# Patient Record
Sex: Female | Born: 1950 | Race: Black or African American | Hispanic: No | Smoking: Never smoker
Health system: Southern US, Community
[De-identification: ages and names within clinical notes are randomized; demographics above are authoritative.]

## PROBLEM LIST (undated history)

## (undated) DIAGNOSIS — D0512 Intraductal carcinoma in situ of left breast: Secondary | ICD-10-CM

## (undated) DIAGNOSIS — Z923 Personal history of irradiation: Secondary | ICD-10-CM

## (undated) DIAGNOSIS — C50919 Malignant neoplasm of unspecified site of unspecified female breast: Secondary | ICD-10-CM

## (undated) DIAGNOSIS — R112 Nausea with vomiting, unspecified: Secondary | ICD-10-CM

## (undated) DIAGNOSIS — E7800 Pure hypercholesterolemia, unspecified: Secondary | ICD-10-CM

## (undated) DIAGNOSIS — I10 Essential (primary) hypertension: Secondary | ICD-10-CM

## (undated) DIAGNOSIS — D509 Iron deficiency anemia, unspecified: Secondary | ICD-10-CM

## (undated) DIAGNOSIS — Z9889 Other specified postprocedural states: Secondary | ICD-10-CM

## (undated) HISTORY — PX: TUBAL LIGATION: SHX77

## (undated) HISTORY — PX: ABDOMINAL HYSTERECTOMY: SHX81

## (undated) HISTORY — DX: Intraductal carcinoma in situ of left breast: D05.12

## (undated) HISTORY — DX: Iron deficiency anemia, unspecified: D50.9

---

## 2000-06-29 ENCOUNTER — Encounter: Payer: Self-pay | Admitting: Obstetrics and Gynecology

## 2000-06-29 ENCOUNTER — Ambulatory Visit (HOSPITAL_COMMUNITY): Admission: RE | Admit: 2000-06-29 | Discharge: 2000-06-29 | Payer: Self-pay | Admitting: Obstetrics and Gynecology

## 2001-07-01 ENCOUNTER — Ambulatory Visit (HOSPITAL_COMMUNITY): Admission: RE | Admit: 2001-07-01 | Discharge: 2001-07-01 | Payer: Self-pay | Admitting: Obstetrics and Gynecology

## 2001-07-01 ENCOUNTER — Encounter: Payer: Self-pay | Admitting: Obstetrics and Gynecology

## 2002-12-18 ENCOUNTER — Ambulatory Visit (HOSPITAL_COMMUNITY): Admission: RE | Admit: 2002-12-18 | Discharge: 2002-12-18 | Payer: Self-pay | Admitting: Family Medicine

## 2004-04-26 ENCOUNTER — Ambulatory Visit (HOSPITAL_COMMUNITY): Admission: RE | Admit: 2004-04-26 | Discharge: 2004-04-26 | Payer: Self-pay | Admitting: Family Medicine

## 2005-06-06 ENCOUNTER — Ambulatory Visit (HOSPITAL_COMMUNITY): Admission: RE | Admit: 2005-06-06 | Discharge: 2005-06-06 | Payer: Self-pay | Admitting: Obstetrics and Gynecology

## 2013-12-15 ENCOUNTER — Other Ambulatory Visit (HOSPITAL_COMMUNITY): Payer: Self-pay | Admitting: Family Medicine

## 2013-12-15 DIAGNOSIS — Z1231 Encounter for screening mammogram for malignant neoplasm of breast: Secondary | ICD-10-CM

## 2013-12-17 ENCOUNTER — Ambulatory Visit (HOSPITAL_COMMUNITY)
Admission: RE | Admit: 2013-12-17 | Discharge: 2013-12-17 | Disposition: A | Discharge status: Home / Self Care | Payer: BC Managed Care – PPO | Source: Ambulatory Visit | Attending: Family Medicine | Admitting: Family Medicine

## 2013-12-17 DIAGNOSIS — Z1231 Encounter for screening mammogram for malignant neoplasm of breast: Secondary | ICD-10-CM | POA: Diagnosis present

## 2013-12-22 ENCOUNTER — Other Ambulatory Visit: Payer: Self-pay | Admitting: Family Medicine

## 2013-12-22 DIAGNOSIS — R928 Other abnormal and inconclusive findings on diagnostic imaging of breast: Secondary | ICD-10-CM

## 2014-01-06 ENCOUNTER — Other Ambulatory Visit: Payer: Self-pay | Admitting: Family Medicine

## 2014-01-06 ENCOUNTER — Other Ambulatory Visit (HOSPITAL_COMMUNITY): Payer: Self-pay | Admitting: Family Medicine

## 2014-01-06 ENCOUNTER — Ambulatory Visit (HOSPITAL_COMMUNITY)
Admission: RE | Admit: 2014-01-06 | Discharge: 2014-01-06 | Disposition: A | Discharge status: Home / Self Care | Payer: BC Managed Care – PPO | Source: Ambulatory Visit | Attending: Family Medicine | Admitting: Family Medicine

## 2014-01-06 DIAGNOSIS — R928 Other abnormal and inconclusive findings on diagnostic imaging of breast: Secondary | ICD-10-CM

## 2014-01-06 DIAGNOSIS — N6320 Unspecified lump in the left breast, unspecified quadrant: Secondary | ICD-10-CM

## 2014-01-06 DIAGNOSIS — R921 Mammographic calcification found on diagnostic imaging of breast: Secondary | ICD-10-CM | POA: Insufficient documentation

## 2014-01-27 ENCOUNTER — Ambulatory Visit (HOSPITAL_COMMUNITY)
Admission: RE | Admit: 2014-01-27 | Discharge: 2014-01-27 | Disposition: A | Discharge status: Home / Self Care | Payer: BC Managed Care – PPO | Source: Ambulatory Visit | Attending: Family Medicine | Admitting: Family Medicine

## 2014-01-27 ENCOUNTER — Other Ambulatory Visit (HOSPITAL_COMMUNITY): Payer: Self-pay | Admitting: Family Medicine

## 2014-01-27 ENCOUNTER — Encounter (HOSPITAL_COMMUNITY): Payer: Self-pay

## 2014-01-27 DIAGNOSIS — N63 Unspecified lump in breast: Secondary | ICD-10-CM | POA: Diagnosis present

## 2014-01-27 DIAGNOSIS — N6320 Unspecified lump in the left breast, unspecified quadrant: Secondary | ICD-10-CM

## 2014-01-27 DIAGNOSIS — R928 Other abnormal and inconclusive findings on diagnostic imaging of breast: Secondary | ICD-10-CM

## 2014-01-27 DIAGNOSIS — R921 Mammographic calcification found on diagnostic imaging of breast: Secondary | ICD-10-CM | POA: Insufficient documentation

## 2014-01-27 HISTORY — DX: Essential (primary) hypertension: I10

## 2014-01-27 MED ORDER — LIDOCAINE HCL (PF) 2 % IJ SOLN
INTRAMUSCULAR | Status: AC
  Administered 2014-01-27: 200 mg
  Filled 2014-01-27: qty 10

## 2014-01-27 NOTE — Discharge Instructions (Signed)
Breast Biopsy °A breast biopsy is a test during which a sample of tissue is taken from your breast. The breast tissue is looked at under a microscope for cancer cells.  °BEFORE THE PROCEDURE °· Make plans to have someone drive you home after the test. °· Do not smoke for 2 weeks before the test. Stop smoking, if you smoke. °· Do not drink alcohol for 24 hours before the test. °· Wear a good support bra to the test. °PROCEDURE  °You may be given one of the following: °· A medicine to numb the breast area (local anesthetic). °· A medicine to make you fall asleep (general anesthetic). °There are different types of breast biopsies. They include: °· Fine-needle aspiration. °¨ A needle is put into the breast lump. °¨ The needle takes out fluid and cells from the lump. °¨ Ultrasound imaging may be used to help find the lump and to put the needle in the right spot. °· Core-needle biopsy. °¨ A needle is put into the breast lump. °¨ The needle is put in your breast 3-6 times. °¨ The needle removes breast tissue. °¨ An ultrasound image or X-ray is often used to find the right spot to put in the needle. °· Stereotactic biopsy. °¨ X-rays and a computer are used to study X-ray pictures of the breast lump. °¨ The computer finds where the needle needs to be put into the breast. °¨ Tissue samples are taken out. °· Vacuum-assisted biopsy. °¨ A small cut (incision) is made in your breast. °¨ A biopsy device is put through the cut and into the breast tissue. °¨ The biopsy device draws abnormal breast tissue into the biopsy device. °¨ A large tissue sample is often removed. °¨ No stitches are needed. °· Ultrasound-guided core-needle biopsy. °¨ Ultrasound imaging helps guide the needle into the area of the breast that is not normal. °¨ A cut is made in the breast. The needle is put into the breast lump. °¨ Tissue samples are taken out. °· Open biopsy. °¨ A large cut is made in the breast. °¨ Your doctor will try to remove the whole  breast lump or as much as possible. °All tissue, fluid, or cell samples are looked at under a microscope.  °AFTER THE PROCEDURE °· You will be taken to an area to recover. You will be able to go home once you are doing well and are without problems. °· You may have bruising on your breast. This is normal. °· A pressure bandage (dressing) may be put on your breast for 24-48 hours. This type of bandage is wrapped tightly around your chest. It helps stop fluid from building up underneath tissues. °Document Released: 04/24/2011 Document Revised: 06/16/2013 Document Reviewed: 04/24/2011 °ExitCare® Patient Information ©2015 [employer]. This information is not intended to replace advice given to you by your health care provider. Make sure you discuss any questions you have with your health care provider. °Breast Biopsy °Care After °These instructions give you information on caring for yourself after your procedure. Your doctor may also give you more specific instructions. Call your doctor if you have any problems or questions after your procedure. °HOME CARE °· Only take medicine as told by your doctor. °· Do not take aspirin. °· Keep your sutures (stitches) dry when bathing. °· Protect the biopsy area. Do not let the area get bumped. °· Avoid activities that could pull the biopsy site open until your doctor approves. This includes: °· Stretching. °· Reaching. °· Exercise. °·   Sports. °· Lifting more than 3lb. °· Continue your normal diet. °· Wear a good support bra for as long as told by your doctor. °· Change any bandages (dressings) as told by your doctor. °· Do not drink alcohol while taking pain medicine. °· Keep all doctor visits as told. Ask when your test results will be ready. Make sure you get your test results. °GET HELP RIGHT AWAY IF:  °· You have a fever. °· You have more bleeding (more than a small spot) from the biopsy site. °· You have trouble breathing. °· You have yellowish-white fluid (pus) coming from  the biopsy site. °· You have redness, puffiness (swelling), or more pain in the biopsy site. °· You have a bad smell coming from the biopsy site. °· Your biopsy site opens after sutures, staples, or sticky strips have been removed. °· You have a rash. °· You need stronger medicine. °MAKE SURE YOU: °· Understand these instructions. °· Will watch your condition. °· Will get help right away if you are not doing well or get worse. °Document Released: 11/26/2008 Document Revised: 04/24/2011 Document Reviewed: 03/12/2011 °ExitCare® Patient Information ©2015 [employer]. This information is not intended to replace advice given to you by your health care provider. Make sure you discuss any questions you have with your health care provider. ° °

## 2014-01-29 ENCOUNTER — Other Ambulatory Visit: Payer: Self-pay | Admitting: Family Medicine

## 2014-01-29 DIAGNOSIS — D0512 Intraductal carcinoma in situ of left breast: Secondary | ICD-10-CM

## 2014-02-04 ENCOUNTER — Ambulatory Visit (HOSPITAL_COMMUNITY)
Admission: RE | Admit: 2014-02-04 | Discharge: 2014-02-04 | Disposition: A | Discharge status: Home / Self Care | Payer: BC Managed Care – PPO | Source: Ambulatory Visit | Attending: Family Medicine | Admitting: Family Medicine

## 2014-02-04 DIAGNOSIS — D0512 Intraductal carcinoma in situ of left breast: Secondary | ICD-10-CM

## 2014-02-04 DIAGNOSIS — C50912 Malignant neoplasm of unspecified site of left female breast: Secondary | ICD-10-CM | POA: Diagnosis present

## 2014-02-04 LAB — POCT I-STAT CREATININE: Creatinine, Ser: 1.2 mg/dL — ABNORMAL HIGH (ref 0.50–1.10)

## 2014-02-04 MED ORDER — GADOBENATE DIMEGLUMINE 529 MG/ML IV SOLN
15.0000 mL | Freq: Once | INTRAVENOUS | Status: AC | PRN
  Administered 2014-02-04: 15 mL via INTRAVENOUS

## 2014-02-19 ENCOUNTER — Other Ambulatory Visit (HOSPITAL_COMMUNITY): Payer: Self-pay | Admitting: General Surgery

## 2014-02-19 DIAGNOSIS — C50912 Malignant neoplasm of unspecified site of left female breast: Secondary | ICD-10-CM

## 2014-02-20 NOTE — H&P (Signed)
  NTS SOAP Note  Vital Signs:  Vitals as of: 32/01/2481: Systolic 500: Diastolic 80: Heart Rate 63: Temp 97.82F: Height 5ft 7in: Weight 164Lbs 0 Ounces: BMI 25.69  BMI : 25.69 kg/m2  Subjective: This 64 year old female presents for of a left breast cancer.  Found on mammography.  Biopsy positive for DCIS,  high grade.  MRI reviewed.  No family h/o breast cancer,  nipple discharge.  Patient thinks she feels lump.  Review of Symptoms:  Constitutional:unremarkable   dizzy spells Eyes:unremarkable   Nose/Mouth/Throat:unremarkable Cardiovascular:  unremarkable Respiratory:unremarkable Gastrointestinal:  unremarkable   Genitourinary:unremarkable   Musculoskeletal:unremarkable Skin:unremarkable as above Hematolgic/Lymphatic:unremarkable   Allergic/Immunologic:unremarkable   Past Medical History:  Reviewed  Past Medical History  Surgical History: none Medical Problems: HTN,  high cholesterol Allergies: nkda Medications: amlodipine,  triamterence,  atorvastatin   Social History:Reviewed  Social History  Preferred Language: English Race:  Black or African American Ethnicity: Not Hispanic / Latino Age: 47 year Marital Status:  M   Smoking Status: Never smoker reviewed on 02/12/2014 Functional Status reviewed on 02/12/2014 ------------------------------------------------ Bathing: Normal Cooking: Normal Dressing: Normal Driving: Normal Eating: Normal Managing Meds: Normal Oral Care: Normal Shopping: Normal Toileting: Normal Transferring: Normal Walking: Normal Cognitive Status reviewed on 02/12/2014 ------------------------------------------------ Attention: Normal Decision Making: Normal Language: Normal Memory: Normal Motor: Normal Perception: Normal Problem Solving: Normal Visual and Spatial: Normal   Family History:Reviewed  Family Health History Mother, Deceased; Hypertension (high blood pressure);  Father, Deceased; Hypertension  (high blood pressure);     Objective Information: General:Well appearing, well nourished in no distress. Neck:Supple without lymphadenopathy.  Heart:RRR, no murmur or gallop.  Normal S1, S2.  No S3, S4.  Lungs:  CTA bilaterally, no wheezes, rhonchi, rales.  Breathing unlabored. Left breast with 2.5cm dominant mass at the 2 o'clock position.  No nipple discharge,  diimpling.  Axilla negative for palpable nodes.  Right breast unremarkable. MRI and path reports reviewed. Assessment:Left breast carcinoma  Diagnoses: 174.9  C50.912 Primary malignant neoplasm of female breast (Malignant neoplasm of unspecified site of left female breast)  Procedures: 37048 - OFFICE OUTPATIENT NEW 30 MINUTES    Plan:Discussed surgical options including MRM vs partial mastectomy/sentinel lymph node biopsy/XRT.  Literature given.  All questions answered.  Patient is scheduled for a left partial mastectomy, sentinel lymph node biopsy, possible axillary dissection on 02/25/2014.  Patient Education:Alternative treatments to surgery were discussed with patient (and family).  Risks and benefits  of procedure were fully explained to the patient (and family) who gave informed consent. Patient/family questions were addressed.

## 2014-02-20 NOTE — Patient Instructions (Signed)
Stacy [name]  02/20/2014   Your procedure is scheduled on:  02/25/2014  Report to [facility] at  0800  AM.  Call this number if you have problems the morning of surgery: [phone]   Remember:   Do not eat food or drink liquids after midnight.   Take these medicines the morning of surgery with A SIP OF WATER:  Amlodipine, lisinopril    Do not wear jewelry, make-up or nail polish.  Do not wear lotions, powders, or perfumes.   Do not shave 48 hours prior to surgery. Men may shave face and neck.  Do not bring valuables to the hospital.  [facility] is not responsible for any belongings or valuables.               Contacts, dentures or bridgework may not be worn into surgery.  Leave suitcase in the car. After surgery it may be brought to your room.  For patients admitted to the hospital, discharge time is determined by your treatment team.               Patients discharged the day of surgery will not be allowed to drive home.  Name and phone number of your driver: family  Special Instructions: Shower using CHG 2 nights before surgery and the night before surgery.  If you shower the day of surgery use CHG.  Use special wash - you have one bottle of CHG for all showers.  You should use approximately 1/3 of the bottle for each shower.   Please read over the following fact sheets that you were given: Pain Booklet, Coughing and Deep Breathing, Surgical Site Infection Prevention, Anesthesia Post-op Instructions and Care and Recovery After Surgery Breast Biopsy A breast biopsy is a procedure where a sample of breast tissue is removed from your breast. The tissue is examined under a microscope to see if cancerous cells are present. A breast biopsy is done when there is:  Any undiagnosed breast mass (tumor).  Nipple abnormalities, dimpling, crusting, or ulcerations.  Abnormal discharge from the nipple, especially blood.  Redness, swelling, and pain of the breast.  Calcium  deposits (calcifications) or abnormalities seen on a mammogram, ultrasound result, or results of magnetic resonance imaging (MRI).  Suspicious changes in the breast seen on your mammogram. If the tumor is found to be cancerous (malignant), a breast biopsy can help to determine what the best treatment is for you. There are many different types of breast biopsies. Talk to your caregiver about your options and which type is best for you. LET YOUR CAREGIVER KNOW ABOUT:  Allergies to food or medicine.  Medicines taken, including vitamins, herbs, eyedrops, over-the-counter medicines, and creams.  Use of steroids (by mouth or creams).  Previous problems with anesthetics or numbing medicines.  History of bleeding problems or blood clots.  Previous surgery.  Other health problems, including diabetes and kidney problems.  Any recent colds or infections.  Possibility of pregnancy, if this applies. RISKS AND COMPLICATIONS   Bleeding.  Infection.  Allergy to medicines.  Bruising and swelling of the breast.  Alteration in the shape of the breast.  Not finding the lump or abnormality.  Needing more surgery. BEFORE THE PROCEDURE  Arrange for someone to drive you home after the procedure.  Do not smoke for 2 weeks before the procedure. Stop smoking, if you smoke.  Do not drink alcohol for 24 hours before procedure.  Wear a good support bra  to the procedure. PROCEDURE  You may be given a medicine to numb the breast area (local anesthesia) or a medicine to make you sleep (general anesthesia) during the procedure. The following are the different types of biopsies that can be performed.   Fine-needle aspiration--A thin needle is attached to a syringe and inserted into the breast lump. Fluid and cells are removed and then looked at under a microscope. If the breast lump cannot be felt, an ultrasound may be used to help locate the lump and place the needle in the correct area.   Core  needle biopsy--A wide, hollow needle (core needle) is inserted into the breast lump 3-6 times to get tissue samples or cores. The samples are removed. The needle is usually placed in the correct area by using an ultrasound or X-ray.   Stereotactic biopsy--X-ray equipment and a computer are used to analyze X-ray pictures of the breast lump. The computer then finds exactly where the core needle needs to be inserted. Tissue samples are removed.   Vacuum-assisted biopsy--A small incision (less than  inch) is made in your breast. A biopsy device that includes a hollow needle and vacuum is passed through the incision and into the breast tissue. The vacuum gently draws abnormal breast tissue into the needle to remove it. This type of biopsy removes a larger tissue sample than a regular core needle biopsy. No stitches are needed, and there is usually little scarring.  Ultrasound-guided core needle biopsy--A high frequency ultrasound helps guide the core needle to the area of the mass or abnormality. An incision is made to insert the needle. Tissue samples are removed.  Open biopsy--A larger incision is made in the breast. Your caregiver will attempt to remove the whole breast lump or as much as possible. AFTER THE PROCEDURE  You will be taken to the recovery area. If you are doing well and have no problems, you will be allowed to go home.  You may notice bruising on your breast. This is normal.  Your caregiver may apply a pressure dressing on your breast for 24-48 hours. A pressure dressing is a bandage that is wrapped tightly around the chest to stop fluid from collecting underneath tissues. Document Released: 01/30/2005 Document Revised: 05/27/2012 Document Reviewed: 03/02/2011 [employer] Patient Information 2015 [place], [place]. This information is not intended to replace advice given to you by your health care provider. Make sure you discuss any questions you have with your health care  provider. Sentinel Lymph Node Biopsy Sentinel lymph node biopsy is a procedure in which a single lymph node is identified, removed, and examined for cancer. Lymph nodes are collections of tissue that help filter infections, cancer cells, and other waste substances from the bloodstream. Certain types of cancer can spread to nearby lymph nodes. The cancer spreads to one lymph node first, and then to others. The first lymph node that your cancer could spread to is called the sentinel lymph node. Examining the sentinel lymph node for cancer can help your caregiver plan future treatment for you. LET YOUR CAREGIVER KNOW ABOUT:   Allergies to food or medicine.  Medicines taken, including vitamins, herbs, eyedrops, over-the-counter medicines, and creams.  Use of steroids (by mouth or creams).  Previous problems with numbing medicines.  History of bleeding problems or blood clots.  Previous surgery.  Other health problems, including diabetes and kidney problems.  Possibility of pregnancy, if this applies. RISKS AND COMPLICATIONS   Infection.  Bleeding.  Allergic reaction to the dye used  for the procedure.  Blue staining of the skin where the dye is injected.  Damaged lymph vessels, causing a buildup of fluid (lymphedema).  Pain or bruising at the biopsy site. BEFORE THE PROCEDURE   Stop smoking at least 2 weeks before the procedure. Not smoking will improve your health after the procedure and decrease the chance of getting a wound infection.  You may have blood tests to make sure your blood clots normally.  Ask your caregiver about changing or stopping your regular medicines.  Do not eat or drink anything for 8 hours before the procedure. PROCEDURE   You will be given medicine that makes you sleep (general anesthetic).  A blue, radioactive dye will be injected near the tumor. The dye will then spread into the sentinel lymph node.  A scanner will identify the sentinel lymph  node.  A small cut (incision) will be made, and the sentinel lymph node will be removed.  The sentinel lymph node will be examined in a lab. Sometimes, a sentinel lymph node biopsy is performed during another surgery, such as a mastectomy or lumpectomy for breast cancer.  AFTER THE PROCEDURE   You will go to a recovery room.  You will be monitored for several hours.  If complications do not occur, you will be allowed to go home a few hours after the procedure.  Your urine may be blue for the next 24 hours. This is normal. It is caused by the dye used during the procedure.  Your skin where the dye was injected may be blue for up to 8 weeks. Document Released: 04/24/2011 Document Reviewed: 03/21/2013 [facility] Patient Information 2015 [place]. This information is not intended to replace advice given to you by your health care provider. Make sure you discuss any questions you have with your health care provider. PATIENT INSTRUCTIONS POST-ANESTHESIA  IMMEDIATELY FOLLOWING SURGERY:  Do not drive or operate machinery for the first twenty four hours after surgery.  Do not make any important decisions for twenty four hours after surgery or while taking narcotic pain medications or sedatives.  If you develop intractable nausea and vomiting or a severe headache please notify your doctor immediately.  FOLLOW-UP:  Please make an appointment with your surgeon as instructed. You do not need to follow up with anesthesia unless specifically instructed to do so.  WOUND CARE INSTRUCTIONS (if applicable):  Keep a dry clean dressing on the anesthesia/puncture wound site if there is drainage.  Once the wound has quit draining you may leave it open to air.  Generally you should leave the bandage intact for twenty four hours unless there is drainage.  If the epidural site drains for more than 36-48 hours please call the anesthesia department.  QUESTIONS?:  Please feel free to call your physician or the  hospital operator if you have any questions, and they will be happy to assist you.

## 2014-02-23 ENCOUNTER — Ambulatory Visit (HOSPITAL_COMMUNITY)
Admission: RE | Admit: 2014-02-23 | Discharge: 2014-02-23 | Disposition: A | Discharge status: Home / Self Care | Payer: BLUE CROSS/BLUE SHIELD | Source: Ambulatory Visit | Attending: General Surgery | Admitting: General Surgery

## 2014-02-23 ENCOUNTER — Ambulatory Visit (HOSPITAL_COMMUNITY): Payer: BLUE CROSS/BLUE SHIELD

## 2014-02-23 ENCOUNTER — Other Ambulatory Visit: Payer: Self-pay

## 2014-02-23 ENCOUNTER — Encounter (HOSPITAL_COMMUNITY): Payer: Self-pay

## 2014-02-23 ENCOUNTER — Encounter (HOSPITAL_COMMUNITY)
Admission: RE | Admit: 2014-02-23 | Discharge: 2014-02-23 | Disposition: A | Discharge status: Home / Self Care | Payer: BLUE CROSS/BLUE SHIELD | Source: Ambulatory Visit | Attending: General Surgery | Admitting: General Surgery

## 2014-02-23 VITALS — BP 145/61 | HR 60 | Temp 97.6°F | Resp 18 | Ht 67.0 in | Wt 164.0 lb

## 2014-02-23 DIAGNOSIS — R531 Weakness: Secondary | ICD-10-CM | POA: Insufficient documentation

## 2014-02-23 DIAGNOSIS — C50919 Malignant neoplasm of unspecified site of unspecified female breast: Secondary | ICD-10-CM

## 2014-02-23 DIAGNOSIS — C50912 Malignant neoplasm of unspecified site of left female breast: Secondary | ICD-10-CM

## 2014-02-23 HISTORY — DX: Other specified postprocedural states: Z98.890

## 2014-02-23 HISTORY — DX: Pure hypercholesterolemia, unspecified: E78.00

## 2014-02-23 HISTORY — DX: Other specified postprocedural states: R11.2

## 2014-02-23 LAB — CBC WITH DIFFERENTIAL/PLATELET
Basophils Absolute: 0 10*3/uL (ref 0.0–0.1)
Basophils Relative: 0 % (ref 0–1)
Eosinophils Absolute: 0.1 10*3/uL (ref 0.0–0.7)
Eosinophils Relative: 2 % (ref 0–5)
HCT: 35.9 % — ABNORMAL LOW (ref 36.0–46.0)
HEMOGLOBIN: 11.3 g/dL — AB (ref 12.0–15.0)
Lymphocytes Relative: 33 % (ref 12–46)
Lymphs Abs: 1.6 10*3/uL (ref 0.7–4.0)
MCH: 23 pg — ABNORMAL LOW (ref 26.0–34.0)
MCHC: 31.5 g/dL (ref 30.0–36.0)
MCV: 73 fL — ABNORMAL LOW (ref 78.0–100.0)
MONOS PCT: 7 % (ref 3–12)
Monocytes Absolute: 0.4 10*3/uL (ref 0.1–1.0)
NEUTROS PCT: 58 % (ref 43–77)
Neutro Abs: 2.7 10*3/uL (ref 1.7–7.7)
Platelets: 202 10*3/uL (ref 150–400)
RBC: 4.92 MIL/uL (ref 3.87–5.11)
RDW: 15.2 % (ref 11.5–15.5)
WBC: 4.8 10*3/uL (ref 4.0–10.5)

## 2014-02-23 LAB — COMPREHENSIVE METABOLIC PANEL
ALT: 20 U/L (ref 0–35)
ANION GAP: 8 (ref 5–15)
AST: 24 U/L (ref 0–37)
Albumin: 4.4 g/dL (ref 3.5–5.2)
Alkaline Phosphatase: 71 U/L (ref 39–117)
BUN: 18 mg/dL (ref 6–23)
CHLORIDE: 102 meq/L (ref 96–112)
CO2: 29 mmol/L (ref 19–32)
Calcium: 9.7 mg/dL (ref 8.4–10.5)
Creatinine, Ser: 1.07 mg/dL (ref 0.50–1.10)
GFR calc Af Amer: 64 mL/min — ABNORMAL LOW (ref >90)
GFR calc non Af Amer: 55 mL/min — ABNORMAL LOW (ref >90)
Glucose, Bld: 120 mg/dL — ABNORMAL HIGH (ref 70–99)
Potassium: 3.9 mmol/L (ref 3.5–5.1)
Sodium: 139 mmol/L (ref 135–145)
Total Bilirubin: 0.6 mg/dL (ref 0.3–1.2)
Total Protein: 7.6 g/dL (ref 6.0–8.3)

## 2014-02-23 NOTE — Pre-Procedure Instructions (Signed)
Patient given information to sign up for my chart at home. 

## 2014-02-25 ENCOUNTER — Encounter (HOSPITAL_COMMUNITY): Payer: Self-pay | Admitting: *Deleted

## 2014-02-25 ENCOUNTER — Ambulatory Visit (HOSPITAL_COMMUNITY): Payer: BLUE CROSS/BLUE SHIELD | Admitting: Anesthesiology

## 2014-02-25 ENCOUNTER — Encounter (HOSPITAL_COMMUNITY)
Admission: RE | Disposition: A | Discharge status: Home / Self Care | Payer: Self-pay | Source: Ambulatory Visit | Attending: General Surgery

## 2014-02-25 ENCOUNTER — Ambulatory Visit (HOSPITAL_COMMUNITY): Payer: BLUE CROSS/BLUE SHIELD

## 2014-02-25 ENCOUNTER — Encounter (HOSPITAL_COMMUNITY)
Admission: RE | Admit: 2014-02-25 | Discharge: 2014-02-25 | Disposition: A | Discharge status: Home / Self Care | Payer: BLUE CROSS/BLUE SHIELD | Source: Ambulatory Visit | Attending: General Surgery | Admitting: General Surgery

## 2014-02-25 ENCOUNTER — Ambulatory Visit (HOSPITAL_COMMUNITY)
Admission: RE | Admit: 2014-02-25 | Discharge: 2014-02-25 | Disposition: A | Discharge status: Home / Self Care | Payer: BLUE CROSS/BLUE SHIELD | Source: Ambulatory Visit | Attending: General Surgery | Admitting: General Surgery

## 2014-02-25 DIAGNOSIS — C50912 Malignant neoplasm of unspecified site of left female breast: Secondary | ICD-10-CM

## 2014-02-25 DIAGNOSIS — Z79899 Other long term (current) drug therapy: Secondary | ICD-10-CM | POA: Insufficient documentation

## 2014-02-25 DIAGNOSIS — I10 Essential (primary) hypertension: Secondary | ICD-10-CM | POA: Insufficient documentation

## 2014-02-25 DIAGNOSIS — C50919 Malignant neoplasm of unspecified site of unspecified female breast: Secondary | ICD-10-CM

## 2014-02-25 DIAGNOSIS — N6320 Unspecified lump in the left breast, unspecified quadrant: Secondary | ICD-10-CM

## 2014-02-25 HISTORY — PX: PARTIAL MASTECTOMY WITH AXILLARY SENTINEL LYMPH NODE BIOPSY: SHX6004

## 2014-02-25 HISTORY — DX: Malignant neoplasm of unspecified site of unspecified female breast: C50.919

## 2014-02-25 SURGERY — PARTIAL MASTECTOMY WITH AXILLARY SENTINEL LYMPH NODE BIOPSY
Anesthesia: General | Site: Breast | Laterality: Left

## 2014-02-25 MED ORDER — MIDAZOLAM HCL 2 MG/2ML IJ SOLN
INTRAMUSCULAR | Status: AC
  Filled 2014-02-25: qty 2

## 2014-02-25 MED ORDER — GLYCOPYRROLATE 0.2 MG/ML IJ SOLN
INTRAMUSCULAR | Status: DC | PRN
  Administered 2014-02-25: 0.6 mg via INTRAVENOUS
  Administered 2014-02-25 (×2): 0.2 mg via INTRAVENOUS

## 2014-02-25 MED ORDER — ONDANSETRON HCL 4 MG/2ML IJ SOLN
4.0000 mg | Freq: Once | INTRAMUSCULAR | Status: AC | PRN
  Administered 2014-02-25: 4 mg via INTRAVENOUS
  Filled 2014-02-25: qty 2

## 2014-02-25 MED ORDER — NEOSTIGMINE METHYLSULFATE 10 MG/10ML IV SOLN
INTRAVENOUS | Status: AC
  Filled 2014-02-25: qty 1

## 2014-02-25 MED ORDER — 0.9 % SODIUM CHLORIDE (POUR BTL) OPTIME
TOPICAL | Status: DC | PRN
  Administered 2014-02-25: 1000 mL

## 2014-02-25 MED ORDER — SODIUM CHLORIDE 0.9 % IJ SOLN
INTRAMUSCULAR | Status: DC | PRN
  Administered 2014-02-25: 10:00:00 via INTRAMUSCULAR

## 2014-02-25 MED ORDER — KETOROLAC TROMETHAMINE 30 MG/ML IJ SOLN
30.0000 mg | Freq: Once | INTRAMUSCULAR | Status: AC
  Administered 2014-02-25: 30 mg via INTRAVENOUS

## 2014-02-25 MED ORDER — SCOPOLAMINE 1 MG/3DAYS TD PT72
MEDICATED_PATCH | TRANSDERMAL | Status: AC
  Filled 2014-02-25: qty 1

## 2014-02-25 MED ORDER — SODIUM CHLORIDE 0.9 % IJ SOLN
INTRAMUSCULAR | Status: AC
  Filled 2014-02-25: qty 10

## 2014-02-25 MED ORDER — SODIUM CHLORIDE 0.9 % IJ SOLN
INTRAMUSCULAR | Status: DC | PRN
  Administered 2014-02-25: 3 mL

## 2014-02-25 MED ORDER — PENTAFLUOROPROP-TETRAFLUOROETH EX AERO
INHALATION_SPRAY | CUTANEOUS | Status: AC
  Filled 2014-02-25: qty 103.5

## 2014-02-25 MED ORDER — DEXAMETHASONE SODIUM PHOSPHATE 4 MG/ML IJ SOLN
INTRAMUSCULAR | Status: AC
  Filled 2014-02-25: qty 1

## 2014-02-25 MED ORDER — KETOROLAC TROMETHAMINE 30 MG/ML IJ SOLN
INTRAMUSCULAR | Status: AC
  Filled 2014-02-25: qty 1

## 2014-02-25 MED ORDER — BUPIVACAINE HCL (PF) 0.5 % IJ SOLN
INTRAMUSCULAR | Status: DC | PRN
  Administered 2014-02-25: 10 mL

## 2014-02-25 MED ORDER — GLYCOPYRROLATE 0.2 MG/ML IJ SOLN
INTRAMUSCULAR | Status: AC
  Filled 2014-02-25: qty 4

## 2014-02-25 MED ORDER — PHENYLEPHRINE HCL 10 MG/ML IJ SOLN
INTRAMUSCULAR | Status: AC
  Filled 2014-02-25: qty 1

## 2014-02-25 MED ORDER — FENTANYL CITRATE 0.05 MG/ML IJ SOLN: 25.0000 ug | INTRAMUSCULAR | Status: DC | PRN

## 2014-02-25 MED ORDER — PROPOFOL 10 MG/ML IV BOLUS
INTRAVENOUS | Status: AC
  Filled 2014-02-25: qty 20

## 2014-02-25 MED ORDER — CHLORHEXIDINE GLUCONATE 4 % EX LIQD: 1.0000 "application " | Freq: Once | CUTANEOUS | Status: DC

## 2014-02-25 MED ORDER — LIDOCAINE HCL (CARDIAC) 20 MG/ML IV SOLN
INTRAVENOUS | Status: DC | PRN
  Administered 2014-02-25: 25 mg via INTRAVENOUS

## 2014-02-25 MED ORDER — LACTATED RINGERS IV SOLN
INTRAVENOUS | Status: DC
  Administered 2014-02-25: 09:00:00 via INTRAVENOUS

## 2014-02-25 MED ORDER — DEXAMETHASONE SODIUM PHOSPHATE 4 MG/ML IJ SOLN
4.0000 mg | Freq: Once | INTRAMUSCULAR | Status: AC
  Administered 2014-02-25: 4 mg via INTRAVENOUS

## 2014-02-25 MED ORDER — SCOPOLAMINE 1 MG/3DAYS TD PT72
1.0000 | MEDICATED_PATCH | Freq: Once | TRANSDERMAL | Status: DC
  Administered 2014-02-25: 1.5 mg via TRANSDERMAL

## 2014-02-25 MED ORDER — ROCURONIUM BROMIDE 50 MG/5ML IV SOLN
INTRAVENOUS | Status: AC
  Filled 2014-02-25: qty 1

## 2014-02-25 MED ORDER — METHYLENE BLUE 1 % INJ SOLN
INTRAMUSCULAR | Status: AC
  Filled 2014-02-25: qty 2

## 2014-02-25 MED ORDER — NEOSTIGMINE METHYLSULFATE 10 MG/10ML IV SOLN
INTRAVENOUS | Status: DC | PRN
  Administered 2014-02-25: 4 mg via INTRAVENOUS

## 2014-02-25 MED ORDER — LACTATED RINGERS IV SOLN
INTRAVENOUS | Status: DC | PRN
  Administered 2014-02-25 (×2): via INTRAVENOUS

## 2014-02-25 MED ORDER — HYDROCODONE-ACETAMINOPHEN 5-325 MG PO TABS: 1.0000 | ORAL_TABLET | ORAL | Status: AC | PRN

## 2014-02-25 MED ORDER — TECHNETIUM TC 99M SULFUR COLLOID FILTERED
0.5000 | Freq: Once | INTRAVENOUS | Status: AC | PRN
  Administered 2014-02-25: 0.5 via INTRADERMAL

## 2014-02-25 MED ORDER — PROPOFOL 10 MG/ML IV BOLUS
INTRAVENOUS | Status: DC | PRN
  Administered 2014-02-25: 90 mg via INTRAVENOUS

## 2014-02-25 MED ORDER — ONDANSETRON HCL 4 MG/2ML IJ SOLN
4.0000 mg | Freq: Once | INTRAMUSCULAR | Status: AC
  Administered 2014-02-25: 4 mg via INTRAVENOUS

## 2014-02-25 MED ORDER — ONDANSETRON HCL 4 MG/2ML IJ SOLN
INTRAMUSCULAR | Status: AC
  Filled 2014-02-25: qty 2

## 2014-02-25 MED ORDER — GLYCOPYRROLATE 0.2 MG/ML IJ SOLN
INTRAMUSCULAR | Status: AC
  Filled 2014-02-25: qty 1

## 2014-02-25 MED ORDER — FENTANYL CITRATE 0.05 MG/ML IJ SOLN
INTRAMUSCULAR | Status: DC | PRN
  Administered 2014-02-25 (×4): 50 ug via INTRAVENOUS

## 2014-02-25 MED ORDER — FENTANYL CITRATE 0.05 MG/ML IJ SOLN
INTRAMUSCULAR | Status: AC
  Filled 2014-02-25: qty 2

## 2014-02-25 MED ORDER — MIDAZOLAM HCL 2 MG/2ML IJ SOLN
1.0000 mg | INTRAMUSCULAR | Status: DC | PRN
  Administered 2014-02-25 (×2): 2 mg via INTRAVENOUS

## 2014-02-25 MED ORDER — BUPIVACAINE HCL (PF) 0.5 % IJ SOLN
INTRAMUSCULAR | Status: AC
  Filled 2014-02-25: qty 30

## 2014-02-25 MED ORDER — ROCURONIUM BROMIDE 100 MG/10ML IV SOLN
INTRAVENOUS | Status: DC | PRN
  Administered 2014-02-25: 35 mg via INTRAVENOUS

## 2014-02-25 MED ORDER — FENTANYL CITRATE 0.05 MG/ML IJ SOLN
INTRAMUSCULAR | Status: AC
  Filled 2014-02-25: qty 5

## 2014-02-25 SURGICAL SUPPLY — 34 items
ADH SKN CLS APL DERMABOND .7 (GAUZE/BANDAGES/DRESSINGS) ×1
APPLIER CLIP 9.375 SM OPEN (CLIP) ×3
APR CLP SM 9.3 20 MLT OPN (CLIP) ×1
BAG HAMPER (MISCELLANEOUS) ×3 IMPLANT
CLIP APPLIE 9.375 SM OPEN (CLIP) ×1 IMPLANT
COVER PROBE W GEL 5X96 (DRAPES) ×3 IMPLANT
COVER SURGICAL LIGHT HANDLE (MISCELLANEOUS) ×3 IMPLANT
DECANTER SPIKE VIAL GLASS SM (MISCELLANEOUS) ×3 IMPLANT
DERMABOND ADVANCED (GAUZE/BANDAGES/DRESSINGS) ×2
DERMABOND ADVANCED .7 DNX12 (GAUZE/BANDAGES/DRESSINGS) ×1 IMPLANT
DURAPREP 26ML APPLICATOR (WOUND CARE) ×3 IMPLANT
ELECT REM PT RETURN 9FT ADLT (ELECTROSURGICAL) ×3
ELECTRODE REM PT RTRN 9FT ADLT (ELECTROSURGICAL) ×1 IMPLANT
GLOVE BIOGEL M 7.0 STRL (GLOVE) ×2 IMPLANT
GLOVE BIOGEL PI IND STRL 7.0 (GLOVE) IMPLANT
GLOVE BIOGEL PI INDICATOR 7.0 (GLOVE) ×2
GLOVE EXAM NITRILE LRG STRL (GLOVE) ×2 IMPLANT
GLOVE SURG SS PI 7.5 STRL IVOR (GLOVE) ×3 IMPLANT
GOWN STRL REUS W/ TWL LRG LVL3 (GOWN DISPOSABLE) ×1 IMPLANT
GOWN STRL REUS W/ TWL XL LVL3 (GOWN DISPOSABLE) ×2 IMPLANT
GOWN STRL REUS W/TWL LRG LVL3 (GOWN DISPOSABLE) ×3
GOWN STRL REUS W/TWL XL LVL3 (GOWN DISPOSABLE) ×3
INST SET MINOR GENERAL (KITS) ×3 IMPLANT
KIT ROOM TURNOVER APOR (KITS) ×3 IMPLANT
LIQUID BAND (GAUZE/BANDAGES/DRESSINGS) ×2 IMPLANT
NS IRRIG 1000ML POUR BTL (IV SOLUTION) ×3 IMPLANT
PACK MINOR (CUSTOM PROCEDURE TRAY) ×3 IMPLANT
PAD ARMBOARD 7.5X6 YLW CONV (MISCELLANEOUS) ×6 IMPLANT
SET BASIN LINEN APH (SET/KITS/TRAYS/PACK) ×3 IMPLANT
SUT SILK 0 FSL (SUTURE) ×3 IMPLANT
SUT VIC AB 3-0 SH 27 (SUTURE) ×3
SUT VIC AB 3-0 SH 27X BRD (SUTURE) ×1 IMPLANT
SUT VIC AB 4-0 PS2 27 (SUTURE) ×5 IMPLANT
SYRINGE 10CC LL (SYRINGE) ×3 IMPLANT

## 2014-02-25 NOTE — Anesthesia Postprocedure Evaluation (Signed)
  Anesthesia Post-op Note  Patient: Stacy [name]  Procedure(s) Performed: Procedure(s): PARTIAL MASTECTOMY WITH AXILLARY SENTINEL LYMPH NODE BIOPSY (Left)  Patient Location: PACU  Anesthesia Type:General  Level of Consciousness: awake, alert , oriented and patient cooperative  Airway and Oxygen Therapy: Patient Spontanous Breathing and Patient connected to face mask oxygen  Post-op Pain: none  Post-op Assessment: Post-op Vital signs reviewed, Patient's Cardiovascular Status Stable, Respiratory Function Stable, Patent Airway, No signs of Nausea or vomiting and Pain level controlled  Post-op Vital Signs: Reviewed and stable  Last Vitals:  Filed Vitals:   02/25/14 1145  BP: 117/54  Pulse: 68  Temp: 36.3 C  Resp: 16    Complications: No apparent anesthesia complications

## 2014-02-25 NOTE — OR Nursing (Signed)
Nuc Med notified that patient ready for Sentinel node injection.

## 2014-02-25 NOTE — Progress Notes (Signed)
Radiology at bedside for sentinal node injection.

## 2014-02-25 NOTE — Discharge Instructions (Signed)
Segmental Mastectomy and Axillary Lymph Node Dissection °Care After °These discharge instructions provide you with general information on caring for yourself after you leave the hospital. While your treatment has been planned according to the most current medical practices available, unavoidable complications occasionally occur. It is important that you know the warning signs of complications so that you can seek treatment. Please read the instructions outlined below and refer to this sheet in the next few weeks. Your doctor may also give you specific information. If you have any complications or questions after discharge, please call your doctor.  °ACTIVITY °· Your doctor will tell you when you may resume strenuous activities, driving and sports. After the drain(s) are removed, you may do light housework, but avoid heavy lifting, carrying or pushing (you should not be lifting anything heavier than 5 lbs.). °· Take frequent rest periods. You may tire more easily than usual. Always rest and elevate the arm affected by your surgery for a period of time equal to your activity time. °· Continue doing the exercises given to you by the physical therapist/occupational therapist even after full range of motion has returned. The amount of time this takes will vary from person to person. Generally, after normal range of motion has returned, some stiffness and soreness may persist for 2-3 months. This is normal and should subside. °· Begin sports or strenuous activities in moderation, giving you a chance to rebuild your endurance. °· Continue to be cautious of heavy lifting or carrying (not more than 10 lbs.) with the affected arm. You may return to work as recommended by your doctor. °NUTRITION °· You may resume your normal diet. °· Make sure you drink plenty of fluids (6-8 glasses per day). °· Eat a well-balanced diet. °· Daily portions of food from the grains, vegetables, fruits, milk, and meat and beans families are  necessary for your health. You may visit http://fnic.nal.usda.gov/ for more dietary information. °HYGIENE °· You may wash your hair. °· If your incision (the cut from surgery) is healed, you may shower or take a bath, unless instructed otherwise by your caregiver. °FEVER °If you feel feverish or have shaking chills, take your temperature. If your temperature is 102° F (38.9° C) or above, call your caregiver. The fever may mean there is an infection. If you call early, infection can be treated with antibiotics and hospitalization may be avoided. °PAIN CONTROL °· Mild discomfort may occur. You may need to take an "over-the-counter" pain medication or a medication prescribed by your doctor. °· Call your doctor if you experience increased pain. °INCISION CARE °Check your incision daily for increased redness, drainage, swelling or separation of skin edges. Call your doctor if any of the above are noted. °DRAIN CARE °If you have a drain in place, ask for instructions for "Surgical Drain Care". °ARM AND HAND CARE °· Since the lymph nodes under your arm have been removed, there may be a greater chance for the arm to swell. °· Avoid, if possible, having blood pressures taken, blood drawn or injections given in the affected arm. °· Use hand lotion to soften fingernail cuticles instead of cutting them to avoid cutting yourself. °· Use an electric shaver to shave your underarms. °· You may use a deodorant after the incision has completely healed. °· Be careful not to cut yourself when cooking, sewing or gardening. °· Do not weigh your arm straight down with a package or your purse. °Note: Follow the exercises and instructions given to you by the physical   therapist/occupational therapist and your treating doctor.  °Document Released: 09/14/2003 Document Revised: 04/24/2011 Document Reviewed: 04/23/2007 °[employer]® Patient Information ©2015 [employer]. This information is not intended to replace advice given to you by your  health care provider. Make sure you discuss any questions you have with your health care provider. ° °

## 2014-02-25 NOTE — Op Note (Signed)
Patient:  Stacy [name]  DOB:  Oct 27, 1950  MRN:  [id number]   Preop Diagnosis:  Left breast carcinoma  Postop Diagnosis:  Same  Procedure:  Left partial mastectomy, sentinel lymph node biopsy  Surgeon:  Aviva Signs, M.D.  Anes:  Gen. 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 She now presents for a left partial mastectomy with sentinel lymph node biopsy, possible axillary dissection. The risks and benefits of the procedure including bleeding, infection, nerve injury, and incomplete resection of the neoplasm were fully explained to the patient, who gave informed consent.  Procedure note:  The patient was placed the supine position. After general anesthesia was administered, the left breast over the neoplasm was injected with 4 mL of dilute methylene blue. This was massaged into the left breast for 5 minutes. She had already undergone radioactive nuclide injection earlier in the day in the preoperative area. The left breast and axilla were then prepped and draped using usual sterile technique with ChloraPrep. Surgical site confirmation was performed.  Using a neoprobe, an incision was made into the left axilla. The ex vivo counts were noted to be around 800-900. Using the neoprobe, a single lymph node was identified. It was blue. It was removed and any bleeding was controlled using small clips. Ex vivo counts were 700. It was sent to pathology for further examination. Frozen section revealed this to be negative for malignancy. Basin counts were less than 10%. The wound was injected with 0.5% Sensorcaine. The subcutaneous layer was reapproximated using 3-0 Vicryl interrupted sutures. The skin was closed using a 4-0 Vicryl subcuticular suture. Liquiban was applied to the wound at the end of both procedures.  A curvilinear incision was made over the palpable mass along  the lateral aspect of the left breast. This was taken down to the chest wall as the mass was noted to be significantly posterior, in close proximity to the chest wall. Normal breast tissue was removed around the palpable tumor. A short suture was placed superiorly and a long suture placed laterally for orientation purposes. The specimen was sent to radiology for specimen radiography. The previously placed clip was within the specimen removed. It was then sent to pathology further examination. A bleeding was controlled using Bovie electrocautery. 0.5% Sensorcaine was instilled the surrounding wound. The skin was closed using a 4-0 Vicryl subcuticular suture. Liquiband was applied.  All tape and needle counts were correct at the end of both procedures. The patient was awakened and transferred to PACU in stable condition.  Complications:  None  EBL:  Minimal  Specimen:  Left axillary sentinel lymph node, left breast tissue, short suture superior, long suture lateral

## 2014-02-25 NOTE — Anesthesia Preprocedure Evaluation (Signed)
Anesthesia Evaluation  Patient identified by MRN, date of birth, ID band Patient awake    Reviewed: Allergy & Precautions, NPO status , Patient's Chart, lab work & pertinent test results  History of Anesthesia Complications (+) PONV and history of anesthetic complications  Airway Mallampati: II  TM Distance: >3 FB Neck ROM: Full    Dental  (+) Partial Upper, Teeth Intact 1cm torus palatinus:   Pulmonary neg pulmonary ROS,  breath sounds clear to auscultation        Cardiovascular hypertension, Pt. on medications Rhythm:Regular Rate:Normal     Neuro/Psych    GI/Hepatic negative GI ROS,   Endo/Other    Renal/GU      Musculoskeletal   Abdominal   Peds  Hematology   Anesthesia Other Findings   Reproductive/Obstetrics                             Anesthesia Physical Anesthesia Plan  ASA: II  Anesthesia Plan: General   Post-op Pain Management:    Induction: Intravenous  Airway Management Planned: Oral ETT  Additional Equipment:   Intra-op Plan:   Post-operative Plan: Extubation in OR  Informed Consent: I have reviewed the patients History and Physical, chart, labs and discussed the procedure including the risks, benefits and alternatives for the proposed anesthesia with the patient or authorized representative who has indicated his/her understanding and acceptance.     Plan Discussed with:   Anesthesia Plan Comments:         Anesthesia Quick Evaluation

## 2014-02-25 NOTE — Transfer of Care (Signed)
Immediate Anesthesia Transfer of Care Note  Patient: Stacy [name]  Procedure(s) Performed: Procedure(s): PARTIAL MASTECTOMY WITH AXILLARY SENTINEL LYMPH NODE BIOPSY (Left)  Patient Location: PACU  Anesthesia Type:General  Level of Consciousness: sedated and patient cooperative  Airway & Oxygen Therapy: Patient Spontanous Breathing and Patient connected to face mask oxygen  Post-op Assessment: Report given to PACU RN and Post -op Vital signs reviewed and stable  Post vital signs: Reviewed and stable  Complications: No apparent anesthesia complications

## 2014-02-25 NOTE — Anesthesia Procedure Notes (Signed)
Procedure Name: Intubation Date/Time: 02/25/2014 10:09 AM Performed by: Andree Elk, AMY A Pre-anesthesia Checklist: Patient identified, Patient being monitored, Timeout performed, Emergency Drugs available and Suction available Patient Re-evaluated:Patient Re-evaluated prior to inductionOxygen Delivery Method: Circle System Utilized Preoxygenation: Pre-oxygenation with 100% oxygen Intubation Type: IV induction Ventilation: Mask ventilation without difficulty Laryngoscope Size: 3 and Miller Grade View: Grade I Tube type: Oral Tube size: 7.0 mm Number of attempts: 1 Airway Equipment and Method: Stylet Placement Confirmation: ETT inserted through vocal cords under direct vision,  positive ETCO2 and breath sounds checked- equal and bilateral Secured at: 21 cm Tube secured with: Tape Dental Injury: Teeth and Oropharynx as per pre-operative assessment

## 2014-02-25 NOTE — Interval H&P Note (Signed)
History and Physical Interval Note:  02/25/2014 9:29 AM  Stacy [name]  has presented today for surgery, with the diagnosis of left breast cancer  The various methods of treatment have been discussed with the patient and family. After consideration of risks, benefits and other options for treatment, the patient has consented to  Procedure(s): PARTIAL MASTECTOMY WITH AXILLARY SENTINEL LYMPH NODE BIOPSY (Left) as a surgical intervention .  The patient's history has been reviewed, patient examined, no change in status, stable for surgery.  I have reviewed the patient's chart and labs.  Questions were answered to the patient's satisfaction.     Aviva Signs A

## 2014-02-26 ENCOUNTER — Encounter (HOSPITAL_COMMUNITY): Payer: Self-pay | Admitting: General Surgery

## 2014-03-18 ENCOUNTER — Encounter (HOSPITAL_COMMUNITY): Payer: BLUE CROSS/BLUE SHIELD | Attending: Hematology & Oncology | Admitting: Hematology & Oncology

## 2014-03-18 ENCOUNTER — Encounter (HOSPITAL_COMMUNITY): Payer: Self-pay | Admitting: Hematology & Oncology

## 2014-03-18 VITALS — BP 151/66 | HR 81 | Temp 98.4°F | Resp 18 | Ht 67.0 in | Wt 162.0 lb

## 2014-03-18 DIAGNOSIS — C50912 Malignant neoplasm of unspecified site of left female breast: Secondary | ICD-10-CM

## 2014-03-18 DIAGNOSIS — D0512 Intraductal carcinoma in situ of left breast: Secondary | ICD-10-CM

## 2014-03-18 DIAGNOSIS — Z780 Asymptomatic menopausal state: Secondary | ICD-10-CM

## 2014-03-18 DIAGNOSIS — Z171 Estrogen receptor negative status [ER-]: Secondary | ICD-10-CM

## 2014-03-18 HISTORY — DX: Intraductal carcinoma in situ of left breast: D05.12

## 2014-03-18 NOTE — Progress Notes (Signed)
[name] CONSULT NOTE  Patient Care Team: [name] Claggett, PA-C as PCP - General (Family Medicine)  CHIEF COMPLAINTS/PURPOSE OF CONSULTATION:  DCIS with 2 foci of invasion at 34mm and 24mm in dimension ER- PR-    HISTORY OF PRESENTING ILLNESS:  Stacy [name] 64 y.o. female is here because of newly diagnosed breast cancer. She had not had routine screening mammography. She states she merely decided one day it was time to have a mammogram. Did not notice anything abnormal in her breast. She states after her mammogram she was called back for an ultrasound, then ultrasound-guided biopsy. She underwent a breast MRI on December 23. She underwent definitive surgical therapy with Dr. Arnoldo Morale on January 13. His operative report the mass was noted to be significantly posterior and in close proximity to the chest wall. Left sentinel node was performed and negative for disease. Notably her ultrasound-guided biopsy showed a high-grade DCIS, but final surgical pathology revealed 2 areas of microinvasion 1 at 1 mm, the second at 2 mm in dimension.  The patient states she is currently doing well she has no pain at the surgery site. She has a good support system and has brought to friends with her today. One of her friends has  been through breast cancer treatment.  She has a lot of questions about additional treatment. She is here today for further discussion and consultation.    Neoplasm of left breast, regional lymph node staging category pN0 per American Joint Committee on Cancer Staging Guidelines, 7th edition   12/17/2013 Mammogram BIRADS category 0, calcifications in the left breast   01/06/2014 Imaging Ultrasound showing a 1.7 x 0.7 x 1.0 irregular hypoechoic mass at the 3 o-clock position 6 cm from nipple   01/06/2014 Mammogram Unilateral L diagnostic mammogram with 3.1x1.6 cm group of pleomorphic calcifications within the lateral L breast, posterior depth   01/27/2014 Initial Biopsy  Ultrasoung guided core biopsy with pathology showing high grade DCIS, ER-, PR-   02/04/2014 Imaging Breast MRI,3.0 x 1.3 x 1.0 cm biopsy proven DCIS in the posterior aspect of the lower outer quadrant of the left breast   02/25/2014 Definitive Surgery Left partial mastectomy, sentinel lymph node biopsy with Dr. Arnoldo Morale   02/27/2014 Initial Diagnosis Neoplasm of left breast, regional lymph node staging category pN0 per [place] on Cancer Staging Guidelines, 7th edition   03/03/2014 Pathology Results Invasive adenocarcinoma, 2 foci, 40mm and 86mm. negative for LVI, high grade DCIS, ER-, PR-. One lymph node negative for tumor. Grade III/III. mpT1a, pN0,pMX     MEDICAL HISTORY:  Past Medical History  Diagnosis Date  . Hypertension   . PONV (postoperative nausea and vomiting)   . Hypercholesteremia   Was already in menopause when had hysterectomy. Was in her 34's when she went through menopause. Never had a screening colonoscopy  SURGICAL HISTORY: Past Surgical History  Procedure Laterality Date  . Abdominal hysterectomy    . Tubal ligation    . Partial mastectomy with axillary sentinel lymph node biopsy Left 02/25/2014    Procedure: PARTIAL MASTECTOMY WITH AXILLARY SENTINEL LYMPH NODE BIOPSY;  Surgeon: Jamesetta So, MD;  Location: [facility];  Service: General;  Laterality: Left;    SOCIAL HISTORY: History   Social History  . Marital Status: Widowed    Spouse Name: N/A    Number of Children: N/A  . Years of Education: N/A   Occupational History  . Not on file.   Social History Main Topics  .  Smoking status: Never Smoker   . Smokeless tobacco: Never Used  . Alcohol Use: Yes     Comment: occassional  . Drug Use: No  . Sexual Activity: Yes    Birth Control/ Protection: Surgical   Other Topics Concern  . Not on file   Social History Narrative   Widowed since 2003. Good support system. 1 child. 1 grandchild. 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 Occasional  ETOH.     FAMILY HISTORY: History reviewed. No pertinent family history. has no family status information on file.  Father died from CHF, 60 Mother died from heart disease, 51 2 Sisters, HTN No family history of breast cancer  ALLERGIES:  has No Known Allergies.  MEDICATIONS:  Current Outpatient Prescriptions  Medication Sig Dispense Refill  . amLODipine (NORVASC) 10 MG tablet Take 10 mg by mouth daily.    Marland Kitchen atorvastatin (LIPITOR) 20 MG tablet Take 20 mg by mouth daily at 6 PM.    . lisinopril (PRINIVIL,ZESTRIL) 40 MG tablet Take 40 mg by mouth daily.    Marland Kitchen triamterene-hydrochlorothiazide (DYAZIDE) 37.5-25 MG per capsule Take 1 capsule by mouth daily.    Marland Kitchen HYDROcodone-acetaminophen (NORCO/VICODIN) 5-325 MG per tablet Take 1-2 tablets by mouth every 4 (four) hours as needed for moderate pain. (Patient not taking: Reported on 03/18/2014) 50 tablet 0   No current facility-administered medications for this visit.    Review of Systems  HENT: Negative.   Eyes:       Wears glasses  Respiratory: Negative.   Cardiovascular: Negative.   Gastrointestinal: Negative.   Genitourinary: Negative.   Musculoskeletal: Negative.   Neurological: Negative.   Endo/Heme/Allergies: Negative.   Psychiatric/Behavioral: Negative.     PHYSICAL EXAMINATION: ECOG PERFORMANCE STATUS: 0 - Asymptomatic  Filed Vitals:   03/18/14 1400  BP: 151/66  Pulse: 81  Temp: 98.4 F (36.9 C)  Resp: 18   Filed Weights   03/18/14 1400  Weight: 162 lb (73.483 kg)     Physical Exam  Constitutional: She is oriented to person, place, and time and well-developed, well-nourished, and in no distress.  HENT:  Head: Normocephalic and atraumatic.  Nose: Nose normal.  Mouth/Throat: Oropharynx is clear and moist. No oropharyngeal exudate.  Eyes: Conjunctivae and EOM are normal. Pupils are equal, round, and reactive to light. Right eye exhibits no discharge. Left eye exhibits no discharge. No scleral icterus.  Neck:  Normal range of motion. Neck supple. No tracheal deviation present. No thyromegaly present.  Cardiovascular: Normal rate, regular rhythm and normal heart sounds.  Exam reveals no gallop and no friction rub.   No murmur heard. Pulmonary/Chest: Effort normal and breath sounds normal. She has no wheezes. She has no rales.    Abdominal: Soft. Bowel sounds are normal. She exhibits no distension and no mass. There is no tenderness. There is no rebound and no guarding.  Musculoskeletal: Normal range of motion. She exhibits no edema.  Lymphadenopathy:    She has no cervical adenopathy.  Neurological: She is alert and oriented to person, place, and time. She has normal reflexes. No cranial nerve deficit. Gait normal. Coordination normal.  Skin: Skin is warm and dry. No rash noted.  Psychiatric: Mood, memory, affect and judgment normal.  Nursing note and vitals reviewed.    LABORATORY DATA:  I have reviewed the data as listed Lab Results  Component Value Date   WBC 4.8 02/23/2014   HGB 11.3* 02/23/2014   HCT 35.9* 02/23/2014   MCV 73.0* 02/23/2014   PLT 202  02/23/2014     Chemistry      Component Value Date/Time   NA 139 02/23/2014 0830   K 3.9 02/23/2014 0830   CL 102 02/23/2014 0830   CO2 29 02/23/2014 0830   BUN 18 02/23/2014 0830   CREATININE 1.07 02/23/2014 0830      Component Value Date/Time   CALCIUM 9.7 02/23/2014 0830   ALKPHOS 71 02/23/2014 0830   AST 24 02/23/2014 0830   ALT 20 02/23/2014 0830   BILITOT 0.6 02/23/2014 0830       RADIOGRAPHIC STUDIES: I have personally reviewed the radiological images as listed and agreed with the findings in the report.  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  Invasive tumor component consisted of 2 areas, one at 1 mm in dimension, and the second at 2 mm in dimension.   We spent a long time in discussion of the different types of breast cancer. I spent a great deal of time in discussion regarding her need for radiation therapy. She would like to do this in [place]. I spent time explaining breast cancer staging, and we discussed the invasive component of her cancer. I reviewed the NCCN guidelines with her in detail. I advised her that based upon current guidelines chemotherapy is not recommended.  HER-2/neu was reported as pending and I have called pathology; however that result is currently not available. I again reviewed the NCCN guidelines which show that if her disease was HER-2 positive, Herceptin is a category 2B recommendation. I advised the patient I would call her once her HER-2 status is known. But I currently do not recommend Herceptin if she is HER-2 positive, based upon the size of her invasive component.   We briefly addressed chemoprevention for another breast cancer, ER positive type. We will discuss this again at her follow-up.  She has never had a screening colonoscopy and we discussed referral for this after completion of her breast cancer therapy. I introduced her to [place] our patient navigator, I advised her to read through the reading materials provided and call us with any questions or concerns prior to her next follow-up with me. We will get her referred to radiation oncology for a discussion about radiation therapy.   Orders Placed This Encounter  Procedures  . CBC with Differential    Standing Status: Future     Number of Occurrences:      Standing Expiration Date: 03/19/2015  . Comprehensive metabolic panel    Standing Status: Future     Number of Occurrences:      Standing Expiration Date: 03/19/2015  . Vitamin D 25 hydroxy    Standing Status: Future     Number of Occurrences:      Standing Expiration Date: 03/19/2015    All  questions were answered. The patient knows to call the clinic with any problems, questions or concerns   Molli Hazard, MD MD 03/18/2014 6:36 PM

## 2014-03-18 NOTE — Assessment & Plan Note (Signed)
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 Tumor component consisted of 2 areas 1 at 1 mm in dimension, and the second at 2 mm in dimension. We spent a long time in discussion of the different types of breast cancer. I spent a great deal of time in discussion regarding her need for radiation therapy. She would like to do this in [place]. I spent time explaining breast cancer staging, and we discussed the invasive component of her cancer. I reviewed the NCCN guidelines with her in detail. I advised her that based upon current guidelines chemotherapy is not recommended.  HER-2/neu was reported as pending and I have called pathology however that result is currently not available. I reviewed again the NCCN guidelines which show that if her disease was HER-2 positive, Herceptin is a category 2B recommendation. I advised the patient I would call her once her HER-2 status is known. But I currently do not recommend Herceptin if she is HER-2 positive.  We briefly addressed chemoprevention for another breast cancer, ER positive type. We will discuss this again at her follow-up.  She has never had a screening colonoscopy and we discussed referral for this after completion of all of her current recommended treatment. I introduced her to [place] [address] our patient navigator, I advised her to read through the reading materials provided and call us with any questions or concerns prior to her next follow-up with me. We will get her referred to radiation oncology for a discussion about radiation therapy.

## 2014-03-18 NOTE — Patient Instructions (Addendum)
[facility] Discharge Instructions  RECOMMENDATIONS MADE BY THE CONSULTANT AND ANY TEST RESULTS WILL BE SENT TO YOUR REFERRING PHYSICIAN.  Please call with any problems or concerns you may have. Please read through the materials we have given you and let me know if you have additional questions. We will see you back in 6 weeks.   Thank you for choosing [facility] at [facility] to provide your oncology and hematology care.  To afford each patient quality time with our provider, please arrive at least 15 minutes before your scheduled appointment time.    You need to re-schedule your appointment should you arrive 10 or more minutes late.  We strive to give you quality time with our providers, and arriving late affects you and other patients whose appointments are after yours.  Also, if you no show three or more times for appointments you may be dismissed from the clinic at the providers discretion.     Again, thank you for choosing [facility].  Our hope is that these requests will decrease the amount of time that you wait before being seen by our physicians.       _____________________________________________________________  Should you have questions after your visit to [facility], please contact our office at [phone] between the hours of 8:30 a.m. and 4:30 p.m.  Voicemails left after 4:30 p.m. will not be returned until the following business day.  For prescription refill requests, have your pharmacy contact our office.

## 2014-03-19 ENCOUNTER — Encounter (HOSPITAL_COMMUNITY): Payer: Self-pay | Admitting: Lab

## 2014-03-19 NOTE — Progress Notes (Signed)
Referral to [facility] / Dr Pablo Ledger Referral faxed on 2/4

## 2014-03-24 ENCOUNTER — Telehealth (HOSPITAL_COMMUNITY): Payer: Self-pay | Admitting: Oncology

## 2014-03-24 NOTE — Telephone Encounter (Signed)
Spoke with [name] in pathology.  Her2 is pending according to pathology report but this is 78 weeks old.  She will follow-up tomorrow AM regarding this information.  Stacy [name] 03/24/2014

## 2014-04-29 ENCOUNTER — Other Ambulatory Visit (HOSPITAL_COMMUNITY): Payer: BLUE CROSS/BLUE SHIELD

## 2014-04-29 ENCOUNTER — Ambulatory Visit (HOSPITAL_COMMUNITY): Payer: BLUE CROSS/BLUE SHIELD | Admitting: Oncology

## 2014-04-29 NOTE — Assessment & Plan Note (Signed)
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 Arnoldo Morale on 02/25/2014.  She is encouraged to follow surveillance guidelines provided by NCCN with annual mammography.  Labs in 6 months: CBC diff, CMET.  Return in 6 months for follow-up.

## 2014-04-29 NOTE — Progress Notes (Signed)
This encounter was created in error - please disregard.

## 2014-05-05 ENCOUNTER — Encounter (HOSPITAL_COMMUNITY): Payer: BLUE CROSS/BLUE SHIELD

## 2014-05-05 ENCOUNTER — Encounter (HOSPITAL_COMMUNITY): Payer: BLUE CROSS/BLUE SHIELD | Attending: Oncology | Admitting: Oncology

## 2014-05-05 VITALS — BP 144/63 | HR 67 | Temp 98.2°F | Resp 18 | Wt 162.9 lb

## 2014-05-05 DIAGNOSIS — Z780 Asymptomatic menopausal state: Secondary | ICD-10-CM

## 2014-05-05 DIAGNOSIS — C50912 Malignant neoplasm of unspecified site of left female breast: Secondary | ICD-10-CM | POA: Diagnosis not present

## 2014-05-05 DIAGNOSIS — D0512 Intraductal carcinoma in situ of left breast: Secondary | ICD-10-CM | POA: Diagnosis not present

## 2014-05-05 LAB — CBC WITH DIFFERENTIAL/PLATELET
BASOS PCT: 0 % (ref 0–1)
Basophils Absolute: 0 10*3/uL (ref 0.0–0.1)
EOS ABS: 0.1 10*3/uL (ref 0.0–0.7)
Eosinophils Relative: 1 % (ref 0–5)
HCT: 37 % (ref 36.0–46.0)
Hemoglobin: 11.7 g/dL — ABNORMAL LOW (ref 12.0–15.0)
LYMPHS ABS: 1 10*3/uL (ref 0.7–4.0)
Lymphocytes Relative: 20 % (ref 12–46)
MCH: 22.8 pg — AB (ref 26.0–34.0)
MCHC: 31.6 g/dL (ref 30.0–36.0)
MCV: 72.1 fL — ABNORMAL LOW (ref 78.0–100.0)
Monocytes Absolute: 0.3 10*3/uL (ref 0.1–1.0)
Monocytes Relative: 5 % (ref 3–12)
Neutro Abs: 3.7 10*3/uL (ref 1.7–7.7)
Neutrophils Relative %: 73 % (ref 43–77)
PLATELETS: 211 10*3/uL (ref 150–400)
RBC: 5.13 MIL/uL — ABNORMAL HIGH (ref 3.87–5.11)
RDW: 15 % (ref 11.5–15.5)
WBC: 5 10*3/uL (ref 4.0–10.5)

## 2014-05-05 LAB — COMPREHENSIVE METABOLIC PANEL
ALBUMIN: 4.6 g/dL (ref 3.5–5.2)
ALT: 20 U/L (ref 0–35)
ANION GAP: 8 (ref 5–15)
AST: 26 U/L (ref 0–37)
Alkaline Phosphatase: 66 U/L (ref 39–117)
BUN: 19 mg/dL (ref 6–23)
CO2: 31 mmol/L (ref 19–32)
Calcium: 9.9 mg/dL (ref 8.4–10.5)
Chloride: 100 mmol/L (ref 96–112)
Creatinine, Ser: 0.99 mg/dL (ref 0.50–1.10)
GFR calc Af Amer: 69 mL/min — ABNORMAL LOW (ref >90)
GFR calc non Af Amer: 60 mL/min — ABNORMAL LOW (ref >90)
Glucose, Bld: 97 mg/dL (ref 70–99)
Potassium: 4.1 mmol/L (ref 3.5–5.1)
Sodium: 139 mmol/L (ref 135–145)
TOTAL PROTEIN: 8.4 g/dL — AB (ref 6.0–8.3)
Total Bilirubin: 0.6 mg/dL (ref 0.3–1.2)

## 2014-05-05 NOTE — Progress Notes (Signed)
Stacy [name] presented for [employer]. Labs per MD order drawn via Peripheral Line 23 gauge needle inserted in right antecubital.  Good blood return present. Procedure without incident.  Needle removed intact. Patient tolerated procedure well.

## 2014-05-05 NOTE — Progress Notes (Signed)
CLAGGETT,[name], PA-C [address] [place] [address]  Neoplasm of left breast, regional lymph node staging category pN0 per American Joint Committee on Cancer Staging Guidelines, 7th edition - Plan: CBC with Differential, Comprehensive metabolic panel  CURRENT THERAPY: Radiation therapy by Dr. Pablo Ledger and Surveillance per NCCN guidelines  INTERVAL HISTORY: Stacy [name] 64 y.o. 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 Arnoldo Morale on 02/25/2014.    Neoplasm of left breast, regional lymph node staging category pN0 per American Joint Committee on Cancer Staging Guidelines, 7th edition   12/17/2013 Mammogram BIRADS category 0, calcifications in the left breast   01/06/2014 Imaging Ultrasound showing a 1.7 x 0.7 x 1.0 irregular hypoechoic mass at the 3 o-clock position 6 cm from nipple   01/06/2014 Mammogram Unilateral L diagnostic mammogram with 3.1x1.6 cm group of pleomorphic calcifications within the lateral L breast, posterior depth   01/27/2014 Initial Biopsy Ultrasoung guided core biopsy with pathology showing high grade DCIS, ER-, PR-   02/04/2014 Imaging Breast MRI,3.0 x 1.3 x 1.0 cm biopsy proven DCIS in the posterior aspect of the lower outer quadrant of the left breast   02/25/2014 Definitive Surgery Left partial mastectomy, sentinel lymph node biopsy with Dr. Arnoldo Morale   02/27/2014 Initial Diagnosis Neoplasm of left breast, regional lymph node staging category pN0 per [place] on Cancer Staging Guidelines, 7th edition   03/03/2014 Pathology Results Invasive adenocarcinoma, 2 foci, 50m and 255m negative for LVI, high grade DCIS, ER-, PR-. One lymph node negative for tumor. Grade III/III. mpT1a, pN0,pMX   03/31/2014 -  Radiation Therapy Dr. WePablo Ledger I personally reviewed and went over laboratory results  with the patient.  The results are noted within this dictation.  She reports that she is finishing radiation therapy by Dr. WePablo Ledgern 05/12/2014.  Therefore, I will update the patient's oncology history with this information.  She is tolerating radiation well without any complaints to date.   Oncologically, she denies any complaints and ROS questioning is negative  Past Medical History  Diagnosis Date  . Hypertension   . PONV (postoperative nausea and vomiting)   . Hypercholesteremia     has Neoplasm of left breast, regional lymph node staging category pN0 per American Joint Committee on Cancer Staging Guidelines, 7th edition on her problem list.     has No Known Allergies.  Ms. Stacy [name] not currently have medications on file.  Past Surgical History  Procedure Laterality Date  . Abdominal hysterectomy    . Tubal ligation    . Partial mastectomy with axillary sentinel lymph node biopsy Left 02/25/2014    Procedure: PARTIAL MASTECTOMY WITH AXILLARY SENTINEL LYMPH NODE BIOPSY;  Surgeon: MaJamesetta SoMD;  Location: [facility];  Service: General;  Laterality: Left;    Denies any headaches, dizziness, double vision, fevers, chills, night sweats, nausea, vomiting, diarrhea, constipation, chest pain, heart palpitations, shortness of breath, blood in stool, black tarry stool, urinary pain, urinary burning, urinary frequency, hematuria.   PHYSICAL EXAMINATION  ECOG PERFORMANCE STATUS: 0 - Asymptomatic  Filed Vitals:   05/05/14 1408  BP: 144/63  Pulse: 67  Temp: 98.2 F (36.8 C)  Resp: 18    GENERAL:alert, no distress, well nourished, well developed, comfortable, cooperative and smiling SKIN: skin color, texture, turgor are normal, no  rashes or significant lesions HEAD: Normocephalic, No masses, lesions, tenderness or abnormalities EYES: normal, PERRLA, EOMI, Conjunctiva are pink and non-injected EARS: External ears normal OROPHARYNX:mucous membranes are moist  NECK: supple,  no adenopathy, thyroid normal size, non-tender, without nodularity, no stridor, non-tender, trachea midline LYMPH:  no palpable lymphadenopathy, no hepatosplenomegaly BREAST:right breast normal without mass, skin or nipple changes or axillary nodes, left post-lumpectomy site healed, but with hardened area in 9 o'clock position anterior to anterior axillary line at site of surgery and radiation. LUNGS: clear to auscultation and percussion HEART: regular rate & rhythm, no murmurs, no gallops, S1 normal and S2 normal ABDOMEN:abdomen soft, non-tender and normal bowel sounds BACK: Back symmetric, no curvature., No CVA tenderness EXTREMITIES:less then 2 second capillary refill, no joint deformities, effusion, or inflammation, no edema, no skin discoloration, no clubbing, no cyanosis  NEURO: alert & oriented x 3 with fluent speech, no focal motor/sensory deficits, gait normal   LABORATORY DATA: CBC    Component Value Date/Time   WBC 4.8 02/23/2014 0830   RBC 4.92 02/23/2014 0830   HGB 11.3* 02/23/2014 0830   HCT 35.9* 02/23/2014 0830   PLT 202 02/23/2014 0830   MCV 73.0* 02/23/2014 0830   MCH 23.0* 02/23/2014 0830   MCHC 31.5 02/23/2014 0830   RDW 15.2 02/23/2014 0830   LYMPHSABS 1.6 02/23/2014 0830   MONOABS 0.4 02/23/2014 0830   EOSABS 0.1 02/23/2014 0830   BASOSABS 0.0 02/23/2014 0830      Chemistry      Component Value Date/Time   NA 139 02/23/2014 0830   K 3.9 02/23/2014 0830   CL 102 02/23/2014 0830   CO2 29 02/23/2014 0830   BUN 18 02/23/2014 0830   CREATININE 1.07 02/23/2014 0830      Component Value Date/Time   CALCIUM 9.7 02/23/2014 0830   ALKPHOS 71 02/23/2014 0830   AST 24 02/23/2014 0830   ALT 20 02/23/2014 0830   BILITOT 0.6 02/23/2014 0830       ASSESSMENT AND PLAN:  Neoplasm of left breast, regional lymph node staging category pN0 per American Joint Committee on Cancer Staging Guidelines, 7th edition 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 Arnoldo Morale on 02/25/2014.  She is encouraged to follow surveillance guidelines provided by NCCN with annual mammography.  Labs in 4 months: CBC diff, CMET.  Return in 4 months for follow-up.  If all is well at that time, we can consider seeing patient every 6 months in accordance to NCCN guidelines.      THERAPY PLAN:  NCCN guidelines recommends the following surveillance for DCIS of breast:  1. Interval history and physical exam every 6-12 months for 5 years, then annually.  2. Mammogram every 12 months (and 6-12 months postradiation therapy if breast conserved [Category 2B]).  3. If treated with Tamoxifen, monitor per NCCN guidelines for Breast Cancer Risk Reduction.   All questions were answered. The patient knows to call the clinic with any problems, questions or concerns. We can certainly see the patient much sooner if necessary.  Patient and plan discussed with Dr. Ancil Linsey and she is in agreement with the aforementioned.   This note is electronically signed by: Robynn Pane 05/05/2014 3:34 PM

## 2014-05-05 NOTE — Assessment & Plan Note (Addendum)
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 Arnoldo Morale on 02/25/2014.  She is encouraged to follow surveillance guidelines provided by NCCN with annual mammography.  Labs in 4 months: CBC diff, CMET.  Return in 4 months for follow-up.  If all is well at that time, we can consider seeing patient every 6 months in accordance to NCCN guidelines.

## 2014-05-05 NOTE — Patient Instructions (Addendum)
[place] at [facility] Discharge Instructions  RECOMMENDATIONS MADE BY THE CONSULTANT AND ANY TEST RESULTS WILL BE SENT TO YOUR REFERRING PHYSICIAN.  Lab work today. We will call you if there are any abnormal results. Return in 4 months for lab work and MD appointment. Report any issues/concerns as needed prior to appointments.  Thank you for choosing [place] at [facility] to provide your oncology and hematology care.  To afford each patient quality time with our provider, please arrive at least 15 minutes before your scheduled appointment time.    You need to re-schedule your appointment should you arrive 10 or more minutes late.  We strive to give you quality time with our providers, and arriving late affects you and other patients whose appointments are after yours.  Also, if you no show three or more times for appointments you may be dismissed from the clinic at the providers discretion.     Again, thank you for choosing [facility].  Our hope is that these requests will decrease the amount of time that you wait before being seen by our physicians.       _____________________________________________________________  Should you have questions after your visit to [facility], please contact our office at [phone] between the hours of 8:30 a.m. and 4:30 p.m.  Voicemails left after 4:30 p.m. will not be returned until the following business day.  For prescription refill requests, have your pharmacy contact our office.

## 2014-05-06 LAB — VITAMIN D 25 HYDROXY (VIT D DEFICIENCY, FRACTURES): Vit D, 25-Hydroxy: 40.5 ng/mL (ref 30.0–100.0)

## 2014-09-02 ENCOUNTER — Other Ambulatory Visit (HOSPITAL_COMMUNITY): Payer: Self-pay | Admitting: Oncology

## 2014-09-02 ENCOUNTER — Ambulatory Visit (HOSPITAL_COMMUNITY): Payer: BLUE CROSS/BLUE SHIELD | Admitting: Oncology

## 2014-09-02 ENCOUNTER — Encounter (HOSPITAL_BASED_OUTPATIENT_CLINIC_OR_DEPARTMENT_OTHER): Payer: BLUE CROSS/BLUE SHIELD | Admitting: Oncology

## 2014-09-02 ENCOUNTER — Encounter (HOSPITAL_COMMUNITY): Payer: BLUE CROSS/BLUE SHIELD | Attending: Hematology & Oncology

## 2014-09-02 ENCOUNTER — Encounter (HOSPITAL_COMMUNITY): Payer: Self-pay | Admitting: Oncology

## 2014-09-02 VITALS — BP 119/52 | HR 70 | Temp 98.4°F | Resp 20 | Wt 164.5 lb

## 2014-09-02 DIAGNOSIS — D649 Anemia, unspecified: Secondary | ICD-10-CM

## 2014-09-02 DIAGNOSIS — C50912 Malignant neoplasm of unspecified site of left female breast: Secondary | ICD-10-CM

## 2014-09-02 DIAGNOSIS — D0512 Intraductal carcinoma in situ of left breast: Secondary | ICD-10-CM

## 2014-09-02 LAB — COMPREHENSIVE METABOLIC PANEL
ALT: 18 U/L (ref 14–54)
ANION GAP: 9 (ref 5–15)
AST: 26 U/L (ref 15–41)
Albumin: 4.4 g/dL (ref 3.5–5.0)
Alkaline Phosphatase: 68 U/L (ref 38–126)
BUN: 23 mg/dL — ABNORMAL HIGH (ref 6–20)
CO2: 29 mmol/L (ref 22–32)
CREATININE: 1.15 mg/dL — AB (ref 0.44–1.00)
Calcium: 9.3 mg/dL (ref 8.9–10.3)
Chloride: 102 mmol/L (ref 101–111)
GFR calc Af Amer: 58 mL/min — ABNORMAL LOW (ref >60)
GFR calc non Af Amer: 50 mL/min — ABNORMAL LOW (ref >60)
GLUCOSE: 125 mg/dL — AB (ref 65–99)
Potassium: 4.2 mmol/L (ref 3.5–5.1)
SODIUM: 140 mmol/L (ref 135–145)
Total Bilirubin: 0.7 mg/dL (ref 0.3–1.2)
Total Protein: 8 g/dL (ref 6.5–8.1)

## 2014-09-02 LAB — CBC WITH DIFFERENTIAL/PLATELET
Basophils Absolute: 0 10*3/uL (ref 0.0–0.1)
Basophils Relative: 0 % (ref 0–1)
Eosinophils Absolute: 0.1 10*3/uL (ref 0.0–0.7)
Eosinophils Relative: 2 % (ref 0–5)
HEMATOCRIT: 33.6 % — AB (ref 36.0–46.0)
Hemoglobin: 10.7 g/dL — ABNORMAL LOW (ref 12.0–15.0)
LYMPHS ABS: 1.2 10*3/uL (ref 0.7–4.0)
Lymphocytes Relative: 30 % (ref 12–46)
MCH: 22.7 pg — ABNORMAL LOW (ref 26.0–34.0)
MCHC: 31.8 g/dL (ref 30.0–36.0)
MCV: 71.2 fL — ABNORMAL LOW (ref 78.0–100.0)
MONO ABS: 0.3 10*3/uL (ref 0.1–1.0)
MONOS PCT: 7 % (ref 3–12)
NEUTROS PCT: 61 % (ref 43–77)
Neutro Abs: 2.3 10*3/uL (ref 1.7–7.7)
Platelets: 183 10*3/uL (ref 150–400)
RBC: 4.72 MIL/uL (ref 3.87–5.11)
RDW: 15 % (ref 11.5–15.5)
WBC: 3.8 10*3/uL — ABNORMAL LOW (ref 4.0–10.5)

## 2014-09-02 NOTE — Addendum Note (Signed)
Addended by: Baird Cancer on: 09/02/2014 02:23 PM   Modules accepted: Orders

## 2014-09-02 NOTE — Progress Notes (Signed)
CLAGGETT,[name], PA-C [address] [place] [address]  Neoplasm of left breast, regional lymph node staging category pN0 per American Joint Committee on Cancer Staging Guidelines, 7th edition  CURRENT THERAPY: Surveillance per NCCN guidelines  INTERVAL HISTORY: Stacy [name] 64 y.o. 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 Arnoldo Morale on 02/25/2014.    Neoplasm of left breast, regional lymph node staging category pN0 per American Joint Committee on Cancer Staging Guidelines, 7th edition   12/17/2013 Mammogram BIRADS category 0, calcifications in the left breast   01/06/2014 Imaging Ultrasound showing a 1.7 x 0.7 x 1.0 irregular hypoechoic mass at the 3 o-clock position 6 cm from nipple   01/06/2014 Mammogram Unilateral L diagnostic mammogram with 3.1x1.6 cm group of pleomorphic calcifications within the lateral L breast, posterior depth   01/27/2014 Initial Biopsy Ultrasoung guided core biopsy with pathology showing high grade DCIS, ER-, PR-   02/04/2014 Imaging Breast MRI,3.0 x 1.3 x 1.0 cm biopsy proven DCIS in the posterior aspect of the lower outer quadrant of the left breast   02/25/2014 Definitive Surgery Left partial mastectomy, sentinel lymph node biopsy with Dr. Arnoldo Morale   02/27/2014 Initial Diagnosis Neoplasm of left breast, regional lymph node staging category pN0 per [place] on Cancer Staging Guidelines, 7th edition   03/03/2014 Pathology Results Invasive adenocarcinoma, 2 foci, 63mm and 12mm. negative for LVI, high grade DCIS, ER-, PR-. One lymph node negative for tumor. Grade III/III. mpT1a, pN0,pMX   04/13/2014 - 05/11/2014 Radiation Therapy 42.72 Gy at 2.67 Gy per fraction of left breast x 16 fractions and 10 Gy left breast boost at 2 Gy per fraction x 5 by Dr. Pablo Ledger   I personally reviewed and  went over laboratory results with the patient.  The results are noted within this dictation.  Labs are being updated today.  She is doing well without any complaints.  She denies any changes in her breasts.  Oncologically, she denies any complaints and ROS questioning is negative  Past Medical History  Diagnosis Date  . Hypertension   . PONV (postoperative nausea and vomiting)   . Hypercholesteremia     has Neoplasm of left breast, regional lymph node staging category pN0 per American Joint Committee on Cancer Staging Guidelines, 7th edition on her problem list.     has No Known Allergies.  Stacy [name] does not currently have medications on file.  Past Surgical History  Procedure Laterality Date  . Abdominal hysterectomy    . Tubal ligation    . Partial mastectomy with axillary sentinel lymph node biopsy Left 02/25/2014    Procedure: PARTIAL MASTECTOMY WITH AXILLARY SENTINEL LYMPH NODE BIOPSY;  Surgeon: Jamesetta So, MD;  Location: [facility];  Service: General;  Laterality: Left;    Denies any headaches, dizziness, double vision, fevers, chills, night sweats, nausea, vomiting, diarrhea, constipation, chest pain, heart palpitations, shortness of breath, blood in stool, black tarry stool, urinary pain, urinary burning, urinary frequency, hematuria.   PHYSICAL EXAMINATION  ECOG PERFORMANCE STATUS: 0 - Asymptomatic  Filed Vitals:   09/02/14 1337  BP: 119/52  Pulse: 70  Temp: 98.4 F (36.9 C)  Resp: 20    GENERAL:alert, no distress, well nourished, well developed, comfortable, cooperative and smiling SKIN: skin color, texture, turgor are normal, no rashes or significant  lesions HEAD: Normocephalic, No masses, lesions, tenderness or abnormalities EYES: normal, PERRLA, EOMI, Conjunctiva are pink and non-injected EARS: External ears normal OROPHARYNX:mucous membranes are moist  NECK: supple, no adenopathy, thyroid normal size, non-tender, without nodularity, no stridor,  non-tender, trachea midline LYMPH:  no palpable lymphadenopathy, no hepatosplenomegaly BREAST:right breast normal without mass, skin or nipple changes or axillary nodes, left post-lumpectomy site healed, but with hardened area in 9 o'clock position anterior to anterior axillary line at site of surgery and radiation and thickening of breast tissue. LUNGS: clear to auscultation and percussion HEART: regular rate & rhythm, no murmurs, no gallops, S1 normal and S2 normal ABDOMEN:abdomen soft, non-tender and normal bowel sounds BACK: Back symmetric, no curvature., No CVA tenderness EXTREMITIES:less then 2 second capillary refill, no joint deformities, effusion, or inflammation, no edema, no skin discoloration, no clubbing, no cyanosis  NEURO: alert & oriented x 3 with fluent speech, no focal motor/sensory deficits, gait normal   LABORATORY DATA: CBC    Component Value Date/Time   WBC 3.8* 09/02/2014 1314   RBC 4.72 09/02/2014 1314   HGB 10.7* 09/02/2014 1314   HCT 33.6* 09/02/2014 1314   PLT 183 09/02/2014 1314   MCV 71.2* 09/02/2014 1314   MCH 22.7* 09/02/2014 1314   MCHC 31.8 09/02/2014 1314   RDW 15.0 09/02/2014 1314   LYMPHSABS 1.2 09/02/2014 1314   MONOABS 0.3 09/02/2014 1314   EOSABS 0.1 09/02/2014 1314   BASOSABS 0.0 09/02/2014 1314      Chemistry      Component Value Date/Time   NA 140 09/02/2014 1314   K 4.2 09/02/2014 1314   CL 102 09/02/2014 1314   CO2 29 09/02/2014 1314   BUN 23* 09/02/2014 1314   CREATININE 1.15* 09/02/2014 1314      Component Value Date/Time   CALCIUM 9.3 09/02/2014 1314   ALKPHOS 68 09/02/2014 1314   AST 26 09/02/2014 1314   ALT 18 09/02/2014 1314   BILITOT 0.7 09/02/2014 1314       ASSESSMENT AND PLAN:  Neoplasm of left breast, regional lymph node staging category pN0 per American Joint Committee on Cancer Staging Guidelines, 7th edition 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 Arnoldo Morale on 02/25/2014.  She is encouraged to follow surveillance guidelines provided by NCCN with annual mammography.    Labs today: CBC diff, CMET.    Labs in 6 months: CBC diff, CMET  She will be due for mammogram in Nov/Dec timeframe 2016.  Return in 6 months for follow-up.     THERAPY PLAN:  NCCN guidelines recommends the following surveillance for DCIS of breast:  1. Interval history and physical exam every 6-12 months for 5 years, then annually.  2. Mammogram every 12 months (and 6-12 months postradiation therapy if breast conserved [Category 2B]).  3. If treated with Tamoxifen, monitor per NCCN guidelines for Breast Cancer Risk Reduction.   All questions were answered. The patient knows to call the clinic with any problems, questions or concerns. We can certainly see the patient much sooner if necessary.  Patient and plan discussed with Dr. Ancil Linsey and she is in agreement with the aforementioned.   This note is electronically signed by: Robynn Pane 09/02/2014 2:20 PM

## 2014-09-02 NOTE — Patient Instructions (Signed)
[place] at [facility] Discharge Instructions  RECOMMENDATIONS MADE BY THE CONSULTANT AND ANY TEST RESULTS WILL BE SENT TO YOUR REFERRING PHYSICIAN.  Exam and discussion by Robynn Pane, PA-C Will call you if any issues with your lab work Report any new lumps, bone pain, shortness of breath or other symptoms. Mammogram in November/December  Labs and office visit in 6 months.  Thank you for choosing [place] at [facility] to provide your oncology and hematology care.  To afford each patient quality time with our provider, please arrive at least 15 minutes before your scheduled appointment time.    You need to re-schedule your appointment should you arrive 10 or more minutes late.  We strive to give you quality time with our providers, and arriving late affects you and other patients whose appointments are after yours.  Also, if you no show three or more times for appointments you may be dismissed from the clinic at the providers discretion.     Again, thank you for choosing [facility].  Our hope is that these requests will decrease the amount of time that you wait before being seen by our physicians.       _____________________________________________________________  Should you have questions after your visit to [facility], please contact our office at [phone] between the hours of 8:30 a.m. and 4:30 p.m.  Voicemails left after 4:30 p.m. will not be returned until the following business day.  For prescription refill requests, have your pharmacy contact our office.

## 2014-09-02 NOTE — Progress Notes (Signed)
LABS DRAWN

## 2014-09-02 NOTE — Assessment & Plan Note (Addendum)
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 Arnoldo Morale on 02/25/2014.  She is encouraged to follow surveillance guidelines provided by NCCN with annual mammography.    Labs today: CBC diff, CMET.    Labs in 6 months: CBC diff, CMET  She will be due for mammogram in Nov/Dec timeframe 2016.  Return in 6 months for follow-up.

## 2014-09-04 ENCOUNTER — Encounter (HOSPITAL_BASED_OUTPATIENT_CLINIC_OR_DEPARTMENT_OTHER): Payer: BLUE CROSS/BLUE SHIELD

## 2014-09-04 DIAGNOSIS — D0512 Intraductal carcinoma in situ of left breast: Secondary | ICD-10-CM

## 2014-09-04 DIAGNOSIS — D649 Anemia, unspecified: Secondary | ICD-10-CM

## 2014-09-04 DIAGNOSIS — C50912 Malignant neoplasm of unspecified site of left female breast: Secondary | ICD-10-CM | POA: Diagnosis not present

## 2014-09-04 LAB — IRON AND TIBC
Iron: 76 ug/dL (ref 28–170)
SATURATION RATIOS: 23 % (ref 10.4–31.8)
TIBC: 330 ug/dL (ref 250–450)
UIBC: 254 ug/dL

## 2014-09-04 LAB — FERRITIN: Ferritin: 236 ng/mL (ref 11–307)

## 2014-09-04 LAB — FOLATE: Folate: 27.5 ng/mL (ref >5.9)

## 2014-09-04 LAB — VITAMIN B12: Vitamin B-12: 1475 pg/mL — ABNORMAL HIGH (ref 180–914)

## 2014-09-07 MED ORDER — HEPARIN SOD (PORK) LOCK FLUSH 100 UNIT/ML IV SOLN
INTRAVENOUS | Status: AC
  Filled 2014-09-07: qty 5

## 2014-09-07 NOTE — Progress Notes (Signed)
Lab draw

## 2015-03-04 ENCOUNTER — Encounter (HOSPITAL_COMMUNITY): Payer: BLUE CROSS/BLUE SHIELD | Attending: Oncology | Admitting: Oncology

## 2015-03-04 ENCOUNTER — Encounter (HOSPITAL_COMMUNITY): Payer: BLUE CROSS/BLUE SHIELD

## 2015-03-04 ENCOUNTER — Encounter (HOSPITAL_COMMUNITY): Payer: Self-pay | Admitting: Oncology

## 2015-03-04 VITALS — BP 131/59 | HR 59 | Temp 98.0°F | Resp 18 | Wt 164.0 lb

## 2015-03-04 DIAGNOSIS — D0512 Intraductal carcinoma in situ of left breast: Secondary | ICD-10-CM | POA: Diagnosis not present

## 2015-03-04 DIAGNOSIS — D509 Iron deficiency anemia, unspecified: Secondary | ICD-10-CM | POA: Insufficient documentation

## 2015-03-04 DIAGNOSIS — C50912 Malignant neoplasm of unspecified site of left female breast: Secondary | ICD-10-CM

## 2015-03-04 HISTORY — DX: Iron deficiency anemia, unspecified: D50.9

## 2015-03-04 LAB — CBC WITH DIFFERENTIAL/PLATELET
BASOS PCT: 0 %
Basophils Absolute: 0 10*3/uL (ref 0.0–0.1)
EOS PCT: 1 %
Eosinophils Absolute: 0 10*3/uL (ref 0.0–0.7)
HCT: 34.6 % — ABNORMAL LOW (ref 36.0–46.0)
Hemoglobin: 11.1 g/dL — ABNORMAL LOW (ref 12.0–15.0)
LYMPHS ABS: 1.5 10*3/uL (ref 0.7–4.0)
Lymphocytes Relative: 33 %
MCH: 23.2 pg — ABNORMAL LOW (ref 26.0–34.0)
MCHC: 32.1 g/dL (ref 30.0–36.0)
MCV: 72.4 fL — AB (ref 78.0–100.0)
MONO ABS: 0.2 10*3/uL (ref 0.1–1.0)
Monocytes Relative: 5 %
Neutro Abs: 2.9 10*3/uL (ref 1.7–7.7)
Neutrophils Relative %: 61 %
PLATELETS: 195 10*3/uL (ref 150–400)
RBC: 4.78 MIL/uL (ref 3.87–5.11)
RDW: 14.9 % (ref 11.5–15.5)
WBC: 4.6 10*3/uL (ref 4.0–10.5)

## 2015-03-04 LAB — COMPREHENSIVE METABOLIC PANEL
ALT: 20 U/L (ref 14–54)
AST: 24 U/L (ref 15–41)
Albumin: 4.4 g/dL (ref 3.5–5.0)
Alkaline Phosphatase: 73 U/L (ref 38–126)
Anion gap: 8 (ref 5–15)
BUN: 23 mg/dL — ABNORMAL HIGH (ref 6–20)
CHLORIDE: 103 mmol/L (ref 101–111)
CO2: 29 mmol/L (ref 22–32)
CREATININE: 1.12 mg/dL — AB (ref 0.44–1.00)
Calcium: 9.7 mg/dL (ref 8.9–10.3)
GFR calc Af Amer: 60 mL/min — ABNORMAL LOW (ref >60)
GFR, EST NON AFRICAN AMERICAN: 52 mL/min — AB (ref >60)
Glucose, Bld: 95 mg/dL (ref 65–99)
POTASSIUM: 3.8 mmol/L (ref 3.5–5.1)
Sodium: 140 mmol/L (ref 135–145)
Total Bilirubin: 0.8 mg/dL (ref 0.3–1.2)
Total Protein: 8 g/dL (ref 6.5–8.1)

## 2015-03-04 NOTE — Assessment & Plan Note (Addendum)
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 Arnoldo Morale on 02/25/2014.  She is encouraged to follow surveillance guidelines provided by NCCN with annual mammography.    Labs today: CBC diff, CMET.    Labs in 3 months: CBC diff, CMET, LDH, ESR, CRP, MM panel, EPO level.  Labs in 6 months: CBC diff, CMET, ferritin.  She is overdue for mammogram which was due in Nov/Dec timeframe 2016.  Order is placed for mammogram.  Return in 6 months for follow-up.

## 2015-03-04 NOTE — Progress Notes (Signed)
[name],[name], PA-C [address] [place] [address]  Ductal carcinoma in situ (DCIS) of left breast - Plan: MM DIAG BREAST TOMO BILATERAL, CBC with Differential, Comprehensive metabolic panel, US BREAST COMPLETE UNI LEFT INC AXILLA, US BREAST COMPLETE UNI RIGHT INC AXILLA  Microcytic anemia - Plan: Lactate dehydrogenase, Sedimentation rate, C-reactive protein, Multiple Myeloma Panel (SPEP&IFE w/QIG), Kappa/lambda light chains, IgG, IgA, IgM, Immunofixation electrophoresis, Protein electrophoresis, serum, CBC with Differential, Comprehensive metabolic panel, Ferritin, Erythropoietin  CURRENT THERAPY: Surveillance per NCCN guidelines  INTERVAL HISTORY: Stacy [name] 65 y.o. 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 Arnoldo Morale on 02/25/2014.    Ductal carcinoma in situ (DCIS) of left breast   12/17/2013 Mammogram BIRADS category 0, calcifications in the left breast   01/06/2014 Imaging Ultrasound showing a 1.7 x 0.7 x 1.0 irregular hypoechoic mass at the 3 o-clock position 6 cm from nipple   01/06/2014 Mammogram Unilateral L diagnostic mammogram with 3.1x1.6 cm group of pleomorphic calcifications within the lateral L breast, posterior depth   01/27/2014 Initial Biopsy Ultrasoung guided core biopsy with pathology showing high grade DCIS, ER-, PR-   02/04/2014 Imaging Breast MRI,3.0 x 1.3 x 1.0 cm biopsy proven DCIS in the posterior aspect of the lower outer quadrant of the left breast   02/25/2014 Definitive Surgery Left partial mastectomy, sentinel lymph node biopsy with Dr. Arnoldo Morale   02/27/2014 Initial Diagnosis Neoplasm of left breast, regional lymph node staging category pN0 per American Joint Committee on Cancer Staging Guidelines, 7th edition   03/03/2014 Pathology Results Invasive adenocarcinoma, 2 foci, 58m and 273m negative  for LVI, high grade DCIS, ER-, PR-. One lymph node negative for tumor. Grade III/III. mpT1a, pN0,pMX   04/13/2014 - 05/11/2014 Radiation Therapy 42.72 Gy at 2.67 Gy per fraction of left breast x 16 fractions and 10 Gy left breast boost at 2 Gy per fraction x 5 by Dr. WePablo Ledger I personally reviewed and went over laboratory results with the patient.  The results are noted within this dictation.  Labs are being updated today.  I personally reviewed and went over radiographic studies with the patient.  The results are noted within this dictation.  She is overdue for mammogram.  Order is placed for mammogram.  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  ROM is WNL.  If her left shoulder pain persists or worsens, she is advised to follow-up with her primary care provider.  She is doing well without any complaints.  She denies any changes in her breasts.  Oncologically, she denies any complaints and ROS questioning is negative  Past Medical History  Diagnosis Date  . Hypertension   . PONV (postoperative nausea and vomiting)   . Hypercholesteremia   . Ductal carcinoma in situ (DCIS) of left breast 03/18/2014    DCIS with invasive tumor at 1 mm and 2 mm in size, grade 3 of 3, sentinel node negative for tumor. Invasive tumor 5 mm from nearest margin. ER negative PR negative HER-2/neu reported as pending. Next  mpT1a, pN0, pMX   . Microcytic anemia 03/04/2015    has Ductal carcinoma in situ (  DCIS) of left breast and Microcytic anemia on her problem list.     has No Known Allergies.  Ms. Ibach does not currently have medications on file.  Past Surgical History  Procedure Laterality Date  . Abdominal hysterectomy    . Tubal ligation    . Partial mastectomy with axillary sentinel lymph node biopsy Left 02/25/2014     Procedure: PARTIAL MASTECTOMY WITH AXILLARY SENTINEL LYMPH NODE BIOPSY;  Surgeon: Jamesetta So, MD;  Location: [facility];  Service: General;  Laterality: Left;    Denies any headaches, dizziness, double vision, fevers, chills, night sweats, nausea, vomiting, diarrhea, constipation, chest pain, heart palpitations, shortness of breath, blood in stool, black tarry stool, urinary pain, urinary burning, urinary frequency, hematuria.   PHYSICAL EXAMINATION  ECOG PERFORMANCE STATUS: 0 - Asymptomatic  Filed Vitals:   03/04/15 1414  BP: 131/59  Pulse: 59  Temp: 98 F (36.7 C)  Resp: 18    GENERAL:alert, no distress, well nourished, well developed, comfortable, cooperative and smiling and unaccompanied today. SKIN: skin color, texture, turgor are normal, no rashes or significant lesions HEAD: Normocephalic, No masses, lesions, tenderness or abnormalities EYES: normal, PERRLA, EOMI, Conjunctiva are pink and non-injected EARS: External ears normal OROPHARYNX:mucous membranes are moist  NECK: supple, no adenopathy, thyroid normal size, non-tender, without nodularity, no stridor, non-tender, trachea midline LYMPH:  no palpable lymphadenopathy, no hepatosplenomegaly BREAST:right breast normal without mass, skin or nipple changes or axillary nodes, left post-lumpectomy site healed, without any new lesions, nodules, skin changes, nipple/areolar changes. LUNGS: clear to auscultation and percussion HEART: regular rate & rhythm, no murmurs, no gallops, S1 normal and S2 normal ABDOMEN:abdomen soft, non-tender and normal bowel sounds BACK: Back symmetric, no curvature., No CVA tenderness EXTREMITIES:less then 2 second capillary refill, no joint deformities, effusion, or inflammation, no edema, no skin discoloration, no clubbing, no cyanosis  NEURO: alert & oriented x 3 with fluent speech, no focal motor/sensory deficits, gait normal   LABORATORY DATA: CBC    Component Value Date/Time   WBC 4.6  03/04/2015 1314   RBC 4.78 03/04/2015 1314   HGB 11.1* 03/04/2015 1314   HCT 34.6* 03/04/2015 1314   PLT 195 03/04/2015 1314   MCV 72.4* 03/04/2015 1314   MCH 23.2* 03/04/2015 1314   MCHC 32.1 03/04/2015 1314   RDW 14.9 03/04/2015 1314   LYMPHSABS PENDING 03/04/2015 1314   MONOABS PENDING 03/04/2015 1314   EOSABS PENDING 03/04/2015 1314   BASOSABS PENDING 03/04/2015 1314      Chemistry      Component Value Date/Time   NA 140 03/04/2015 1314   K 3.8 03/04/2015 1314   CL 103 03/04/2015 1314   CO2 29 03/04/2015 1314   BUN 23* 03/04/2015 1314   CREATININE 1.12* 03/04/2015 1314      Component Value Date/Time   CALCIUM 9.7 03/04/2015 1314   ALKPHOS 73 03/04/2015 1314   AST 24 03/04/2015 1314   ALT 20 03/04/2015 1314   BILITOT 0.8 03/04/2015 1314       ASSESSMENT AND PLAN:  Ductal carcinoma in situ (DCIS) of left breast ER/PR negative DCIS of Left Breast, with no sufficient tissue for HER2 testing, with 2 foci of invasion at 1 mm and 2 mm dimensions; S/P left partial mastectomy by Dr. Arnoldo Morale on 02/25/2014.  She is encouraged to follow surveillance guidelines provided by NCCN with annual mammography.    Labs today: CBC diff, CMET.    Labs in 3 months: CBC diff, CMET, LDH, ESR, CRP, MM  panel, EPO level.  Labs in 6 months: CBC diff, CMET, ferritin.  She is overdue for mammogram which was due in Nov/Dec timeframe 2016.  Order is placed for mammogram.  Return in 6 months for follow-up.   Microcytic anemia Persistent anemia but stable, etiology unclear, in the setting of mild renal disease.  Further peripheral work-up is ordered in 3 months.    THERAPY PLAN:  NCCN guidelines recommends the following surveillance for DCIS of breast:  1. Interval history and physical exam every 6-12 months for 5 years, then annually.  2. Mammogram every 12 months (and 6-12 months postradiation therapy if breast conserved [Category 2B]).  3. If treated with Tamoxifen, monitor per NCCN  guidelines for Breast Cancer Risk Reduction.   All questions were answered. The patient knows to call the clinic with any problems, questions or concerns. We can certainly see the patient much sooner if necessary.  Patient and plan discussed with Dr. Ancil Linsey and she is in agreement with the aforementioned.   This note is electronically signed by: Robynn Pane 03/04/2015 3:00 PM

## 2015-03-04 NOTE — Patient Instructions (Signed)
[place] at [facility] Discharge Instructions  RECOMMENDATIONS MADE BY THE CONSULTANT AND ANY TEST RESULTS WILL BE SENT TO YOUR REFERRING PHYSICIAN.  Exam done and seen by Kirby Crigler PA-C Need to get mammogram this month Labs at 70months and 29months Return in 60months for follow up Take aleve or advil for left shoulder pain as needed.  Thank you for choosing [place] at [facility] to provide your oncology and hematology care.  To afford each patient quality time with our provider, please arrive at least 15 minutes before your scheduled appointment time.   Beginning January 23rd 2017 lab work for the [facility] will be done in the  Main lab at [facility] on 1st floor. If you have a lab appointment with the [facility] please come in thru the  Main Entrance and check in at the main information desk  You need to re-schedule your appointment should you arrive 10 or more minutes late.  We strive to give you quality time with our providers, and arriving late affects you and other patients whose appointments are after yours.  Also, if you no show three or more times for appointments you may be dismissed from the clinic at the providers discretion.     Again, thank you for choosing [facility].  Our hope is that these requests will decrease the amount of time that you wait before being seen by our physicians.       _____________________________________________________________  Should you have questions after your visit to [facility], please contact our office at [phone] between the hours of 8:30 a.m. and 4:30 p.m.  Voicemails left after 4:30 p.m. will not be returned until the following business day.  For prescription refill requests, have your pharmacy contact our office.

## 2015-03-04 NOTE — Assessment & Plan Note (Signed)
Persistent anemia but stable, etiology unclear, in the setting of mild renal disease.  Further peripheral work-up is ordered in 3 months.

## 2015-03-12 ENCOUNTER — Other Ambulatory Visit (HOSPITAL_COMMUNITY): Payer: Self-pay | Admitting: Oncology

## 2015-03-12 DIAGNOSIS — D0512 Intraductal carcinoma in situ of left breast: Secondary | ICD-10-CM

## 2015-03-16 ENCOUNTER — Ambulatory Visit (HOSPITAL_COMMUNITY)
Admission: RE | Admit: 2015-03-16 | Discharge: 2015-03-16 | Disposition: A | Discharge status: Home / Self Care | Payer: BLUE CROSS/BLUE SHIELD | Source: Ambulatory Visit | Attending: Oncology | Admitting: Oncology

## 2015-03-16 DIAGNOSIS — D0512 Intraductal carcinoma in situ of left breast: Secondary | ICD-10-CM

## 2015-03-16 DIAGNOSIS — Z853 Personal history of malignant neoplasm of breast: Secondary | ICD-10-CM | POA: Diagnosis not present

## 2015-03-16 DIAGNOSIS — Z923 Personal history of irradiation: Secondary | ICD-10-CM | POA: Diagnosis not present

## 2015-03-30 ENCOUNTER — Encounter (INDEPENDENT_AMBULATORY_CARE_PROVIDER_SITE_OTHER): Payer: Self-pay | Admitting: *Deleted

## 2015-06-03 ENCOUNTER — Other Ambulatory Visit (HOSPITAL_COMMUNITY): Payer: BLUE CROSS/BLUE SHIELD

## 2015-06-04 ENCOUNTER — Encounter (HOSPITAL_COMMUNITY): Payer: BLUE CROSS/BLUE SHIELD | Attending: Oncology

## 2015-06-04 DIAGNOSIS — D509 Iron deficiency anemia, unspecified: Secondary | ICD-10-CM | POA: Diagnosis not present

## 2015-06-04 LAB — CBC WITH DIFFERENTIAL/PLATELET
BASOS ABS: 0 10*3/uL (ref 0.0–0.1)
Basophils Relative: 0 %
EOS PCT: 3 %
Eosinophils Absolute: 0.1 10*3/uL (ref 0.0–0.7)
HEMATOCRIT: 33.5 % — AB (ref 36.0–46.0)
Hemoglobin: 10.7 g/dL — ABNORMAL LOW (ref 12.0–15.0)
LYMPHS ABS: 1.4 10*3/uL (ref 0.7–4.0)
LYMPHS PCT: 33 %
MCH: 23.1 pg — AB (ref 26.0–34.0)
MCHC: 31.9 g/dL (ref 30.0–36.0)
MCV: 72.2 fL — AB (ref 78.0–100.0)
MONO ABS: 0.2 10*3/uL (ref 0.1–1.0)
MONOS PCT: 5 %
NEUTROS ABS: 2.6 10*3/uL (ref 1.7–7.7)
Neutrophils Relative %: 59 %
PLATELETS: 173 10*3/uL (ref 150–400)
RBC: 4.64 MIL/uL (ref 3.87–5.11)
RDW: 14.9 % (ref 11.5–15.5)
WBC: 4.4 10*3/uL (ref 4.0–10.5)

## 2015-06-04 LAB — C-REACTIVE PROTEIN: CRP: 1 mg/dL — AB (ref <1.0)

## 2015-06-04 LAB — COMPREHENSIVE METABOLIC PANEL
ALT: 19 U/L (ref 14–54)
AST: 28 U/L (ref 15–41)
Albumin: 4.1 g/dL (ref 3.5–5.0)
Alkaline Phosphatase: 61 U/L (ref 38–126)
Anion gap: 10 (ref 5–15)
BILIRUBIN TOTAL: 0.4 mg/dL (ref 0.3–1.2)
BUN: 22 mg/dL — AB (ref 6–20)
CHLORIDE: 104 mmol/L (ref 101–111)
CO2: 29 mmol/L (ref 22–32)
Calcium: 9.4 mg/dL (ref 8.9–10.3)
Creatinine, Ser: 1.12 mg/dL — ABNORMAL HIGH (ref 0.44–1.00)
GFR, EST AFRICAN AMERICAN: 59 mL/min — AB (ref >60)
GFR, EST NON AFRICAN AMERICAN: 51 mL/min — AB (ref >60)
Glucose, Bld: 105 mg/dL — ABNORMAL HIGH (ref 65–99)
POTASSIUM: 4.1 mmol/L (ref 3.5–5.1)
Sodium: 143 mmol/L (ref 135–145)
TOTAL PROTEIN: 7.6 g/dL (ref 6.5–8.1)

## 2015-06-04 LAB — SEDIMENTATION RATE: SED RATE: 20 mm/h (ref 0–22)

## 2015-06-04 LAB — FERRITIN: FERRITIN: 237 ng/mL (ref 11–307)

## 2015-06-04 LAB — LACTATE DEHYDROGENASE: LDH: 134 U/L (ref 98–192)

## 2015-06-05 LAB — IGG, IGA, IGM
IgA: 268 mg/dL (ref 87–352)
IgG (Immunoglobin G), Serum: 1292 mg/dL (ref 700–1600)
IgM, Serum: 112 mg/dL (ref 26–217)

## 2015-06-05 LAB — ERYTHROPOIETIN: ERYTHROPOIETIN: 10.6 m[IU]/mL (ref 2.6–18.5)

## 2015-06-07 LAB — PROTEIN ELECTROPHORESIS, SERUM
A/G Ratio: 1.3 (ref 0.7–1.7)
ALPHA-1-GLOBULIN: 0.2 g/dL (ref 0.0–0.4)
ALPHA-2-GLOBULIN: 0.6 g/dL (ref 0.4–1.0)
Albumin ELP: 4 g/dL (ref 2.9–4.4)
Beta Globulin: 1.1 g/dL (ref 0.7–1.3)
GAMMA GLOBULIN: 1.2 g/dL (ref 0.4–1.8)
Globulin, Total: 3.1 g/dL (ref 2.2–3.9)
TOTAL PROTEIN ELP: 7.1 g/dL (ref 6.0–8.5)

## 2015-06-07 LAB — KAPPA/LAMBDA LIGHT CHAINS
KAPPA FREE LGHT CHN: 28.57 mg/L — AB (ref 3.30–19.40)
Kappa, lambda light chain ratio: 1.24 (ref 0.26–1.65)
LAMDA FREE LIGHT CHAINS: 23.09 mg/L (ref 5.71–26.30)

## 2015-06-07 LAB — IMMUNOFIXATION ELECTROPHORESIS
IGA: 287 mg/dL (ref 87–352)
IGG (IMMUNOGLOBIN G), SERUM: 1282 mg/dL (ref 700–1600)
IGM, SERUM: 118 mg/dL (ref 26–217)
Total Protein ELP: 7 g/dL (ref 6.0–8.5)

## 2015-06-08 LAB — MULTIPLE MYELOMA PANEL, SERUM
ALBUMIN SERPL ELPH-MCNC: 4 g/dL (ref 2.9–4.4)
ALPHA2 GLOB SERPL ELPH-MCNC: 0.6 g/dL (ref 0.4–1.0)
Albumin/Glob SerPl: 1.3 (ref 0.7–1.7)
Alpha 1: 0.2 g/dL (ref 0.0–0.4)
B-GLOBULIN SERPL ELPH-MCNC: 1.1 g/dL (ref 0.7–1.3)
Gamma Glob SerPl Elph-Mcnc: 1.2 g/dL (ref 0.4–1.8)
Globulin, Total: 3.1 g/dL (ref 2.2–3.9)
IGG (IMMUNOGLOBIN G), SERUM: 1381 mg/dL (ref 700–1600)
IGM, SERUM: 117 mg/dL (ref 26–217)
IgA: 283 mg/dL (ref 87–352)
TOTAL PROTEIN ELP: 7.1 g/dL (ref 6.0–8.5)

## 2015-08-25 ENCOUNTER — Telehealth: Payer: Self-pay

## 2015-08-25 NOTE — Telephone Encounter (Signed)
[phone]  PATIENT RECEIVED LETTER TO SCHEDULE TCS

## 2015-08-30 NOTE — Telephone Encounter (Signed)
Tried to call and mail box is full.  

## 2015-09-03 ENCOUNTER — Ambulatory Visit (HOSPITAL_COMMUNITY): Payer: BLUE CROSS/BLUE SHIELD | Admitting: Hematology & Oncology

## 2015-09-03 ENCOUNTER — Other Ambulatory Visit (HOSPITAL_COMMUNITY): Payer: BLUE CROSS/BLUE SHIELD

## 2015-09-13 NOTE — Telephone Encounter (Signed)
LMOM to call. Letter mailed also.

## 2015-09-14 ENCOUNTER — Telehealth: Payer: Self-pay

## 2015-09-14 NOTE — Telephone Encounter (Signed)
Triaged pt today.

## 2015-09-14 NOTE — Telephone Encounter (Signed)
Gastroenterology Pre-Procedure Review  Request Date: 09/14/2015 Requesting Physician: Ivan Anchors, FNP  PATIENT REVIEW QUESTIONS: The patient responded to the following health history questions as indicated:    1. Diabetes Melitis: no 2. Joint replacements in the past 12 months: no 3. Major health problems in the past 3 months: no 4. Has an artificial valve or MVP: no 5. Has a defibrillator: no 6. Has been advised in past to take antibiotics in advance of a procedure like teeth cleaning: no 7. Family history of colon cancer: no  8. Alcohol Use: occasionally / seldom 9. History of sleep apnea: no     MEDICATIONS & ALLERGIES:    Patient reports the following regarding taking any blood thinners:   Plavix? no Aspirin? no Coumadin? no  Patient confirms/reports the following medications:  Current Outpatient Prescriptions  Medication Sig Dispense Refill  . amLODipine (NORVASC) 10 MG tablet Take 10 mg by mouth daily.    Marland Kitchen atorvastatin (LIPITOR) 20 MG tablet Take 20 mg by mouth daily at 6 PM.    . lisinopril (PRINIVIL,ZESTRIL) 40 MG tablet Take 40 mg by mouth daily.    Marland Kitchen triamterene-hydrochlorothiazide (DYAZIDE) 37.5-25 MG per capsule Take 1 capsule by mouth daily.     No current facility-administered medications for this visit.     Patient confirms/reports the following allergies:  No Known Allergies  No orders of the defined types were placed in this encounter.   AUTHORIZATION INFORMATION Primary Insurance:   ID #:  Group #:  Pre-Cert / Auth required:  Pre-Cert / Auth #:   Secondary Insurance:  ID #:  Group #:  Pre-Cert / Auth required:  Pre-Cert / Auth #:   SCHEDULE INFORMATION: Procedure has been scheduled as follows:  Date: 10/12/2015              Time: 9:30 Am Location: [facility] Short Stay  This Gastroenterology Pre-Precedure Review Form is being routed to the following provider(s): R. Garfield Cornea, MD

## 2015-09-15 NOTE — Telephone Encounter (Signed)
Appropriate.

## 2015-09-16 ENCOUNTER — Other Ambulatory Visit: Payer: Self-pay

## 2015-09-16 DIAGNOSIS — Z1211 Encounter for screening for malignant neoplasm of colon: Secondary | ICD-10-CM

## 2015-09-16 MED ORDER — PEG 3350-KCL-NA BICARB-NACL 420 G PO SOLR: 4000.0000 mL | ORAL | 0 refills | Status: AC

## 2015-09-16 NOTE — Telephone Encounter (Signed)
Rx sent to the pharmacy and instructions mailed to pt.  

## 2015-09-30 ENCOUNTER — Encounter (HOSPITAL_COMMUNITY): Payer: Self-pay | Admitting: Oncology

## 2015-09-30 ENCOUNTER — Ambulatory Visit (HOSPITAL_COMMUNITY): Payer: BLUE CROSS/BLUE SHIELD | Admitting: Hematology & Oncology

## 2015-09-30 ENCOUNTER — Encounter (HOSPITAL_COMMUNITY): Payer: BLUE CROSS/BLUE SHIELD | Attending: Oncology

## 2015-09-30 ENCOUNTER — Encounter (HOSPITAL_COMMUNITY): Payer: BLUE CROSS/BLUE SHIELD | Attending: Oncology | Admitting: Oncology

## 2015-09-30 VITALS — BP 138/54 | HR 48 | Temp 98.2°F | Resp 16 | Wt 159.2 lb

## 2015-09-30 DIAGNOSIS — D0512 Intraductal carcinoma in situ of left breast: Secondary | ICD-10-CM | POA: Diagnosis present

## 2015-09-30 DIAGNOSIS — Z171 Estrogen receptor negative status [ER-]: Secondary | ICD-10-CM | POA: Diagnosis not present

## 2015-09-30 LAB — CBC WITH DIFFERENTIAL/PLATELET
BASOS ABS: 0 10*3/uL (ref 0.0–0.1)
BASOS PCT: 1 %
Eosinophils Absolute: 0.1 10*3/uL (ref 0.0–0.7)
Eosinophils Relative: 3 %
HCT: 37 % (ref 36.0–46.0)
HEMOGLOBIN: 11.6 g/dL — AB (ref 12.0–15.0)
LYMPHS PCT: 32 %
Lymphs Abs: 1.3 10*3/uL (ref 0.7–4.0)
MCH: 22.8 pg — ABNORMAL LOW (ref 26.0–34.0)
MCHC: 31.4 g/dL (ref 30.0–36.0)
MCV: 72.8 fL — ABNORMAL LOW (ref 78.0–100.0)
MONO ABS: 0.3 10*3/uL (ref 0.1–1.0)
Monocytes Relative: 8 %
NEUTROS ABS: 2.3 10*3/uL (ref 1.7–7.7)
Neutrophils Relative %: 57 %
Platelets: 220 10*3/uL (ref 150–400)
RBC: 5.08 MIL/uL (ref 3.87–5.11)
RDW: 14.9 % (ref 11.5–15.5)
WBC: 4.1 10*3/uL (ref 4.0–10.5)

## 2015-09-30 LAB — COMPREHENSIVE METABOLIC PANEL
ALBUMIN: 4.3 g/dL (ref 3.5–5.0)
ALK PHOS: 72 U/L (ref 38–126)
ALT: 21 U/L (ref 14–54)
ANION GAP: 7 (ref 5–15)
AST: 25 U/L (ref 15–41)
BUN: 24 mg/dL — ABNORMAL HIGH (ref 6–20)
CO2: 29 mmol/L (ref 22–32)
Calcium: 9.7 mg/dL (ref 8.9–10.3)
Chloride: 102 mmol/L (ref 101–111)
Creatinine, Ser: 1.11 mg/dL — ABNORMAL HIGH (ref 0.44–1.00)
GFR calc Af Amer: 60 mL/min — ABNORMAL LOW (ref >60)
GFR calc non Af Amer: 52 mL/min — ABNORMAL LOW (ref >60)
GLUCOSE: 116 mg/dL — AB (ref 65–99)
POTASSIUM: 4.4 mmol/L (ref 3.5–5.1)
SODIUM: 138 mmol/L (ref 135–145)
Total Bilirubin: 0.7 mg/dL (ref 0.3–1.2)
Total Protein: 8.1 g/dL (ref 6.5–8.1)

## 2015-09-30 NOTE — Progress Notes (Signed)
[place] PROGRESS NOTE  Patient Care Team: Shade Flood, MD as PCP - General (Family Medicine)  CHIEF COMPLAINTS/PURPOSE OF CONSULTATION:  DCIS with 2 foci of invasion at 89m and 2348min dimension ER- PR-    HISTORY OF PRESENTING ILLNESS:  Stacy RELEFORD89.o. female is here because of newly diagnosed breast cancer. She had not had routine screening mammography. She states she merely decided one day it was time to have a mammogram. Did not notice anything abnormal in her breast. She states after her mammogram she was called back for an ultrasound, then ultrasound-guided biopsy. She underwent a breast MRI on December 23. She underwent definitive surgical therapy with Dr. JeArnoldo Moralen January 13. His operative report the mass was noted to be significantly posterior and in close proximity to the chest wall. Left sentinel node was performed and negative for disease. Notably her ultrasound-guided biopsy showed a high-grade DCIS, but final surgical pathology revealed 2 areas of microinvasion 1 at 1 mm, the second at 2 mm in dimension.  Patient is here for ongoing evaluation and treatment consideration  09/30/2015  [name].   She confirms that she had her last mammogram done in January.   [facility] that nothing's occurred; no emergency visits, no "excitement." She also confirms that she doesn't smoke. In terms of anything new, she reports that she is having minor night sweats, not every night, but every so often. When asked if this is causing her issues, she says no. Patient is here for ongoing evaluation and treatment consideration She works in dialysis.  During the physical exam, she says she hasn't found anything that concerns her during her self breast examinations. A breast exam was performed during her appointment today.      Ductal carcinoma in situ (DCIS) of  left breast   12/17/2013 Mammogram    BIRADS category 0, calcifications in the left breast     01/06/2014 Imaging    Ultrasound showing a 1.7 x 0.7 x 1.0 irregular hypoechoic mass at the 3 o-clock position 6 cm from nipple     01/06/2014 Mammogram    Unilateral L diagnostic mammogram with 3.1x1.6 cm group of pleomorphic calcifications within the lateral L breast, posterior depth     01/27/2014 Initial Biopsy    Ultrasoung guided core biopsy with pathology showing high grade DCIS, ER-, PR-     02/04/2014 Imaging    Breast MRI,3.0 x 1.3 x 1.0 cm biopsy proven DCIS in the posterior aspect of the lower outer quadrant of the left breast     02/25/2014 Definitive Surgery    Left partial mastectomy, sentinel lymph node biopsy with Dr. JeArnoldo Morale   02/27/2014 Initial Diagnosis    Neoplasm of left breast, regional lymph node staging category pN0 per American Joint Committee on Cancer Staging Guidelines, 7th edition     03/03/2014 Pathology Results    Invasive adenocarcinoma, 2 foci, 48m20mnd 2mm10megative for LVI, high grade DCIS, ER-, PR-. One lymph node negative for tumor. Grade III/III. mpT1a, pN0,pMX     04/13/2014 - 05/11/2014 Radiation Therapy    42.72 Gy at 2.67 Gy per fraction of left breast x 16 fractions and 10 Gy left breast boost at 2 Gy per fraction x 5 by Dr. WentPablo Ledger   MEDICAL HISTORY:  Past Medical History:  Diagnosis Date  Ductal carcinoma in situ (DCIS) of left breast 03/18/2014   DCIS with invasive tumor at 1 mm and 2 mm in size, grade 3 of 3, sentinel node negative for tumor. Invasive tumor 5 mm from nearest margin. ER negative PR negative HER-2/neu reported as pending. Next  mpT1a, pN0, pMX    Hypercholesteremia    Hypertension    Microcytic anemia 03/04/2015   PONV (postoperative nausea and vomiting)   Was already in menopause when had hysterectomy. Was in her 21's when she went through menopause. Never had a screening colonoscopy  SURGICAL HISTORY: Past  Surgical History:  Procedure Laterality Date   ABDOMINAL HYSTERECTOMY     PARTIAL MASTECTOMY WITH AXILLARY SENTINEL LYMPH NODE BIOPSY Left 02/25/2014   Procedure: PARTIAL MASTECTOMY WITH AXILLARY SENTINEL LYMPH NODE BIOPSY;  Surgeon: Jamesetta So, MD;  Location: [facility];  Service: General;  Laterality: Left;   TUBAL LIGATION      SOCIAL HISTORY: Social History   Social History   Marital status: Widowed    Spouse name: N/A   Number of children: N/A   Years of education: N/A   Occupational History   Not on file.   Social History Main Topics   Smoking status: Never Smoker   Smokeless tobacco: Never Used   Alcohol use Yes     Comment: occassional   Drug use: No   Sexual activity: Yes    Birth control/ protection: Surgical   Other Topics Concern   Not on file   Social History Narrative   Widowed since 2003. Good support system. 1 child. 1 grandchild. 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 Occasional ETOH.     FAMILY HISTORY: Family History  Problem Relation Age of Onset   Heart failure Father     died at 90 from CHF   Heart failure Mother     died at 74 from heart disease   Hypertension Sister    Hypertension Sister    indicated that the status of her mother is unknown. She indicated that the status of her father is unknown.   Father died from CHF, 79 Mother died from heart disease, 91 2 Sisters, HTN No family history of breast cancer  ALLERGIES:  has No Known Allergies.  MEDICATIONS:  Current Outpatient Prescriptions  Medication Sig Dispense Refill   amLODipine (NORVASC) 10 MG tablet Take 10 mg by mouth daily.     atorvastatin (LIPITOR) 20 MG tablet Take 20 mg by mouth daily at 6 PM.     lisinopril (PRINIVIL,ZESTRIL) 40 MG tablet Take 40 mg by mouth daily.     triamterene-hydrochlorothiazide (DYAZIDE) 37.5-25 MG per capsule Take 1 capsule by mouth daily.     polyethylene glycol-electrolytes (TRILYTE) 420 g solution Take 4,000 mLs  by mouth as directed. (Patient not taking: Reported on 09/30/2015) 4000 mL 0   No current facility-administered medications for this visit.     Review of Systems  HENT: Negative.   Eyes:       Wears glasses  Respiratory: Negative.   Cardiovascular: Negative.   Gastrointestinal: Negative.   Genitourinary: Negative.   Musculoskeletal: Negative.   Neurological: Negative.   Endo/Heme/Allergies: Negative.   Psychiatric/Behavioral: Negative.     PHYSICAL EXAMINATION: ECOG PERFORMANCE STATUS: 0 - Asymptomatic  Vitals:   09/30/15 0934  BP: (!) 138/54  Pulse: (!) 48  Resp: 16  Temp: 98.2 F (36.8 C)   Filed Weights   09/30/15 0934  Weight: 159 lb 3.2 oz (72.2 kg)  Physical Exam  Constitutional: She is oriented to person, place, and time and well-developed, well-nourished, and in no distress.  HENT:  Head: Normocephalic and atraumatic.  Nose: Nose normal.  Mouth/Throat: Oropharynx is clear and moist. No oropharyngeal exudate.  Eyes: Conjunctivae and EOM are normal. Pupils are equal, round, and reactive to light. Right eye exhibits no discharge. Left eye exhibits no discharge. No scleral icterus.  Neck: Normal range of motion. Neck supple. No tracheal deviation present. No thyromegaly present.  Cardiovascular: Normal rate, regular rhythm and normal heart sounds.  Exam reveals no gallop and no friction rub.   No murmur heard. Pulmonary/Chest: Effort normal and breath sounds normal. She has no wheezes. She has no rales.    Abdominal: Soft. Bowel sounds are normal. She exhibits no distension and no mass. There is no tenderness. There is no rebound and no guarding.  Musculoskeletal: Normal range of motion. She exhibits no edema.  Lymphadenopathy:    She has no cervical adenopathy.  Neurological: She is alert and oriented to person, place, and time. She has normal reflexes. No cranial nerve deficit. Gait normal. Coordination normal.  Skin: Skin is warm and dry. No rash noted.    Psychiatric: Mood, memory, affect and judgment normal.  Nursing note and vitals reviewed.  Examination of breast: Within normal limit.  No palpable masses.  Postoperative changes.  LABORATORY DATA:  I have reviewed the data as listed    Chemistry      Component Value Date/Time   NA 138 09/30/2015 0903   K 4.4 09/30/2015 0903   CL 102 09/30/2015 0903   CO2 29 09/30/2015 0903   BUN 24 (H) 09/30/2015 0903   CREATININE 1.11 (H) 09/30/2015 0903      Component Value Date/Time   CALCIUM 9.7 09/30/2015 0903   ALKPHOS 72 09/30/2015 0903   AST 25 09/30/2015 0903   ALT 21 09/30/2015 0903   BILITOT 0.7 09/30/2015 0903     Results for YANNIS, GUMBS (MRN [id number]) as of 09/30/2015 09:47  Ref. Range 09/30/2015 09:03  Sodium Latest Ref Range: 135 - 145 mmol/L 138  Potassium Latest Ref Range: 3.5 - 5.1 mmol/L 4.4  Chloride Latest Ref Range: 101 - 111 mmol/L 102  CO2 Latest Ref Range: 22 - 32 mmol/L 29  BUN Latest Ref Range: 6 - 20 mg/dL 24 (H)  Creatinine Latest Ref Range: 0.44 - 1.00 mg/dL 1.11 (H)  Calcium Latest Ref Range: 8.9 - 10.3 mg/dL 9.7  EGFR (Non-African Amer.) Latest Ref Range: >60 mL/min 52 (L)  EGFR (African American) Latest Ref Range: >60 mL/min 60 (L)  Glucose Latest Ref Range: 65 - 99 mg/dL 116 (H)  Anion gap Latest Ref Range: 5 - 15  7  Alkaline Phosphatase Latest Ref Range: 38 - 126 U/L 72  Albumin Latest Ref Range: 3.5 - 5.0 g/dL 4.3  AST Latest Ref Range: 15 - 41 U/L 25  ALT Latest Ref Range: 14 - 54 U/L 21  Total Protein Latest Ref Range: 6.5 - 8.1 g/dL 8.1  cbcis pending for review  RADIOGRAPHIC STUDIES: I have personally reviewed the radiological images as listed and agreed with the findings in the report.  ASSESSMENT & PLAN:  Neoplasm of left breast, regional lymph node staging category pN0 per American Joint Committee on Cancer Staging Guidelines, 7th edition Pleasant 65 year old female who presented with abnormal mammogram, ultimately diagnosed with  ER negative PR negative DCIS with evidence of microinvasion. Invasive tumor component consisted of 2 areas, one at  1 mm in dimension, and the second at 2 mm in dimension.   We spent a long time in discussion of the different types of breast cancer. I spent a great deal of time in discussion regarding her need for radiation therapy. She would like to do this in [place]. I spent time explaining breast cancer staging, and we discussed the invasive component of her cancer. I reviewed the NCCN guidelines with her in detail. I advised her that based upon current guidelines chemotherapy is not recommended.  HER-2/neu was reported as pending and I have called pathology; however that result is currently not available. I again reviewed the NCCN guidelines which show that if her disease was HER-2 positive, Herceptin is a category 2B recommendation. I advised the patient I would call her once her HER-2 status is known. But I currently do not recommend Herceptin if she is HER-2 positive, based upon the size of her invasive component.   We briefly addressed chemoprevention for another breast cancer, ER positive type. We will discuss this again at her follow-up.  She has never had a screening colonoscopy and we discussed referral for this after completion of her breast cancer therapy. I introduced her to [place] our patient navigator, I advised her to read through the reading materials provided and call us with any questions or concerns prior to her next follow-up with me. We will get her referred to radiation oncology for a discussion about radiation therapy.   09/30/2015  A breast exam was performed during her appointment today.  We will set her up for her next bilateral diagnostic mammogram in January.   We discussed how, at present, there is no discernable reason for her night sweats, but she should keep an eye on them to let us know if anything else happens in the meantime.  She will return for follow-up in 6  months with routine lab work, CBC, and chemistry. As the results of all of her labs are not back at the time of her appointment today, we will call her if anything is unusual.  No orders of the defined types were placed in this encounter.  All questions were answered. The patient knows to call the clinic with any problems, questions or concerns. We can certainly see the patient much sooner if necessary.  This document serves as a record of services personally performed by Forest Gleason, MD. It was created on her behalf by Toni Amend, a trained medical scribe. The creation of this record is based on the scribe's personal observations and the provider's statements to them. This document has been checked and approved by the attending provider.  I have reviewed the above documentation for accuracy and completeness and I agree with the above. Mammogram has been reviewed independently.  They are within normal limit.  Repeat mammogram S been scheduled in January of 2018   Toni Amend MD 09/30/2015 9:50 AM

## 2015-09-30 NOTE — Patient Instructions (Signed)
[place] at [facility] Discharge Instructions  RECOMMENDATIONS MADE BY THE CONSULTANT AND ANY TEST RESULTS WILL BE SENT TO YOUR REFERRING PHYSICIAN.  You saw Dr. Oliva Bustard today in place of [place]. Follow up in 6 months with labs Your next mammogram is due in January 2018. If night sweats worsen or happen more frequently call and let us know.  Thank you for choosing [place] at [facility] to provide your oncology and hematology care.  To afford each patient quality time with our provider, please arrive at least 15 minutes before your scheduled appointment time.   Beginning January 23rd 2017 lab work for the [facility] will be done in the  Main lab at [facility] on 1st floor. If you have a lab appointment with the [facility] please come in thru the  Main Entrance and check in at the main information desk  You need to re-schedule your appointment should you arrive 10 or more minutes late.  We strive to give you quality time with our providers, and arriving late affects you and other patients whose appointments are after yours.  Also, if you no show three or more times for appointments you may be dismissed from the clinic at the providers discretion.     Again, thank you for choosing [facility].  Our hope is that these requests will decrease the amount of time that you wait before being seen by our physicians.       _____________________________________________________________  Should you have questions after your visit to [facility], please contact our office at [phone] between the hours of 8:30 a.m. and 4:30 p.m.  Voicemails left after 4:30 p.m. will not be returned until the following business day.  For prescription refill requests, have your pharmacy contact our office.         Resources For Cancer Patients and their Caregivers ? American Cancer Society: Can assist with transportation, wigs,  general needs, runs Look Good Feel Better.        385-828-1421 ? Cancer Care: Provides financial assistance, online support groups, medication/co-pay assistance.  1-800-813-HOPE (906)843-4577) ? [place] Assists [place] cancer patients and their families through emotional , educational and financial support.  (813) 572-0644 ? [place] DSS Where to apply for food stamps, Medicaid and utility assistance. 732 256 7957 ? RCATS: Transportation to medical appointments. (903)395-2038 ? Social Security Administration: May apply for disability if have a Stage IV cancer. (872)044-4151 4086208325 ? [employer], Disability and Transit Services: Assists with nutrition, care and transit needs. [place] Support Programs: @10RELATIVEDAYS @ > Cancer Support Group  2nd Tuesday of the month 1pm-2pm, Journey Room  > Creative Journey  3rd Tuesday of the month 1130am-1pm, Journey Room  > Look Good Feel Better  1st Wednesday of the month 10am-12 noon, Journey Room (Call [place] to register [phone])

## 2015-10-11 ENCOUNTER — Telehealth: Payer: Self-pay

## 2015-10-11 NOTE — Telephone Encounter (Signed)
I called [employer] @ [phone] and spoke to [place] who said a PA is not required for screening colonoscopy. Reference # [id number].

## 2015-10-11 NOTE — Telephone Encounter (Signed)
Pt called to say the insurance co told her that we were not in network. I told her we are in network with the local [facility]. I spoke with Ginger, who said she could talk to someone at the [facility] and see if they could help her.  I gave her the number [phone] to speak with Kimmie and to call me if there is a problem.

## 2015-10-12 ENCOUNTER — Telehealth: Payer: Self-pay

## 2015-10-12 ENCOUNTER — Ambulatory Visit (HOSPITAL_COMMUNITY)
Admission: RE | Admit: 2015-10-12 | Payer: BLUE CROSS/BLUE SHIELD | Source: Ambulatory Visit | Admitting: Internal Medicine

## 2015-10-12 SURGERY — COLONOSCOPY
Anesthesia: Moderate Sedation

## 2015-10-12 NOTE — Telephone Encounter (Signed)
Pt was returning a call from DS from yesterday. Please call [phone]

## 2015-10-12 NOTE — Telephone Encounter (Signed)
Ginger informed me that pt was a no show for her procedure at [facility] this AM.   I called work and she is not there today and I called home [facility] for a return call.   I spoke to [place] at the service center and she said pt had left her two Vm yesterday, and one of them said she was going to cancel the procedure for today.  Britney said also, we are in network for [employer] and she has no idea what the pt was concerned about.

## 2015-10-13 NOTE — Telephone Encounter (Signed)
I returned call and [facility] for a return call.

## 2016-03-17 ENCOUNTER — Other Ambulatory Visit: Payer: Self-pay | Admitting: Oncology

## 2016-03-17 DIAGNOSIS — Z9889 Other specified postprocedural states: Secondary | ICD-10-CM

## 2016-03-21 ENCOUNTER — Encounter (HOSPITAL_COMMUNITY): Payer: Self-pay

## 2016-03-21 ENCOUNTER — Ambulatory Visit (HOSPITAL_COMMUNITY)
Admission: RE | Admit: 2016-03-21 | Discharge: 2016-03-21 | Disposition: A | Discharge status: Home / Self Care | Payer: 59 | Source: Ambulatory Visit | Attending: Oncology | Admitting: Oncology

## 2016-03-21 DIAGNOSIS — Z853 Personal history of malignant neoplasm of breast: Secondary | ICD-10-CM | POA: Diagnosis not present

## 2016-03-21 DIAGNOSIS — D0512 Intraductal carcinoma in situ of left breast: Secondary | ICD-10-CM

## 2016-03-21 DIAGNOSIS — Z9889 Other specified postprocedural states: Secondary | ICD-10-CM | POA: Diagnosis not present

## 2016-03-21 HISTORY — DX: Personal history of irradiation: Z92.3

## 2016-03-21 HISTORY — DX: Malignant neoplasm of unspecified site of unspecified female breast: C50.919

## 2016-03-26 IMAGING — US US LT BREAST BX W LOC DEV 1ST LESION IMG BX SPEC US GUIDE
1 series · 13 of 13 positions shown · non-contrast
Comparison: Previous exams.

ADDENDUM:
The pathology associated with the left breast ultrasound-guided core
biopsy demonstrated DCIS. This is concordant with the imaging
findings. I have discussed the findings with the patient by
telephone and answered her questions. Patient states her biopsy site
is clean and dry without hematoma formation. Post biopsy wound care
instructions were reviewed with the patient. Patient will be
scheduled for breast MRI and surgical consultation will be arranged.
The patient was encouraged to call our [REDACTED] for additional
questions or concerns.
CLINICAL DATA: Left breast mass was suspicious calcifications.

EXAM:
ULTRASOUND GUIDED left BREAST CORE NEEDLE BIOPSY

[Series 1: us left breast bx w loc dev 1st lesion img bx spec · 0.07mm/px · 13 of 13 slices shown]
[im 1/13]
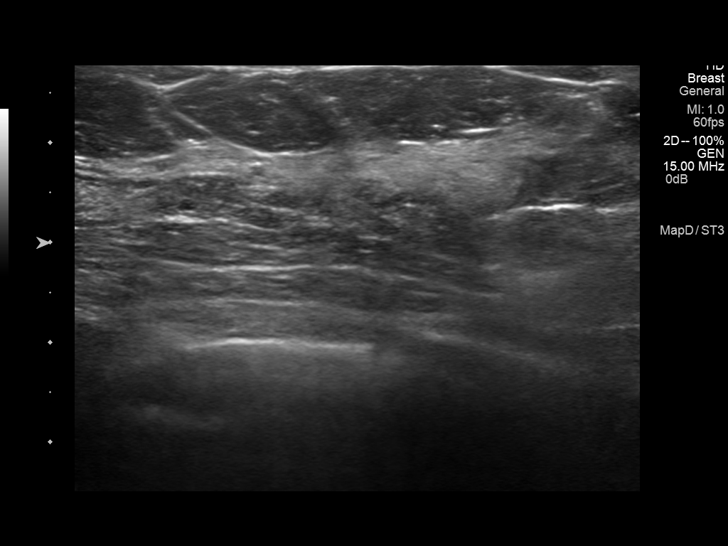
[im 2/13]
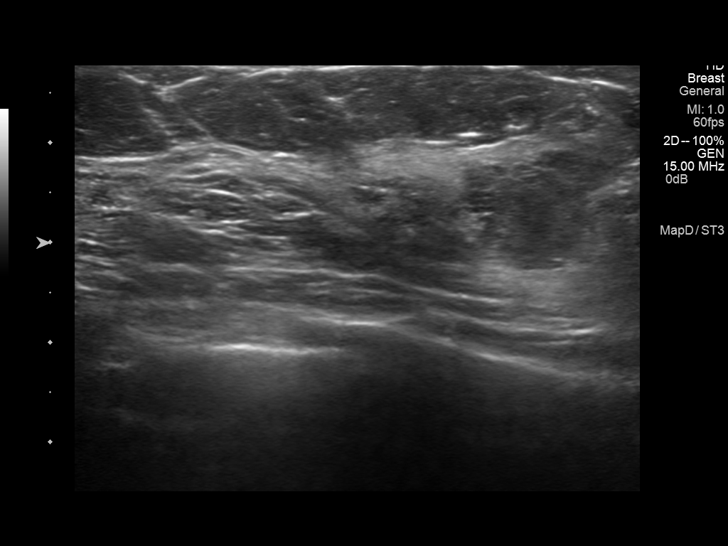
[im 3/13]
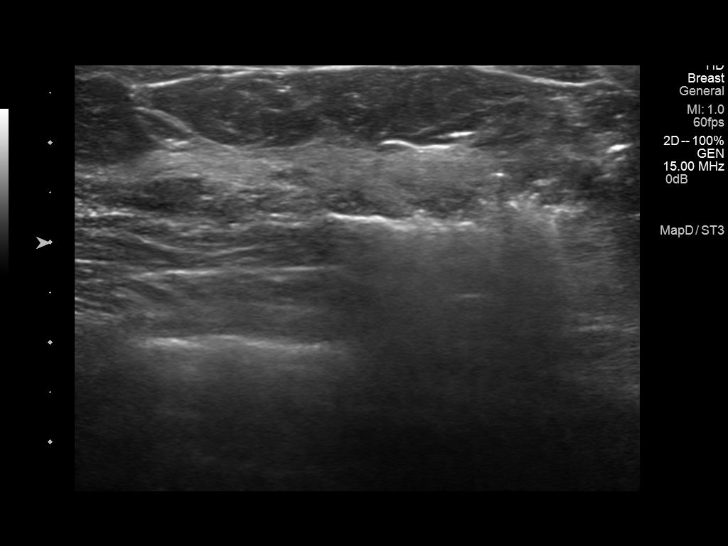
[im 4/13]
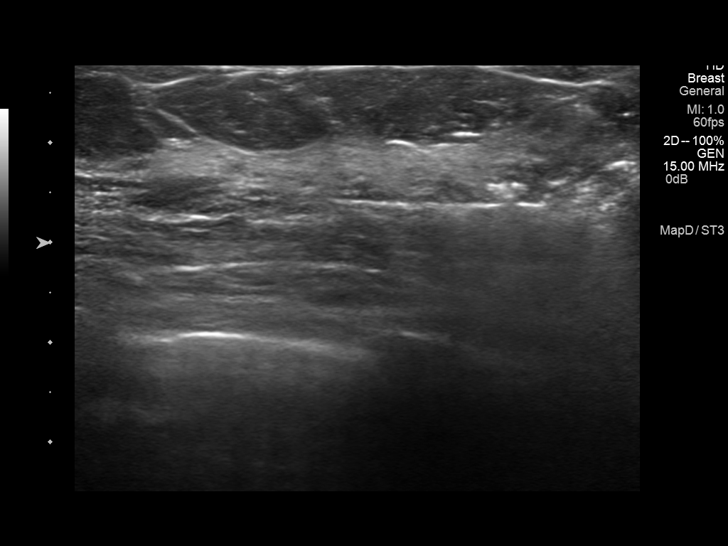
[im 5/13]
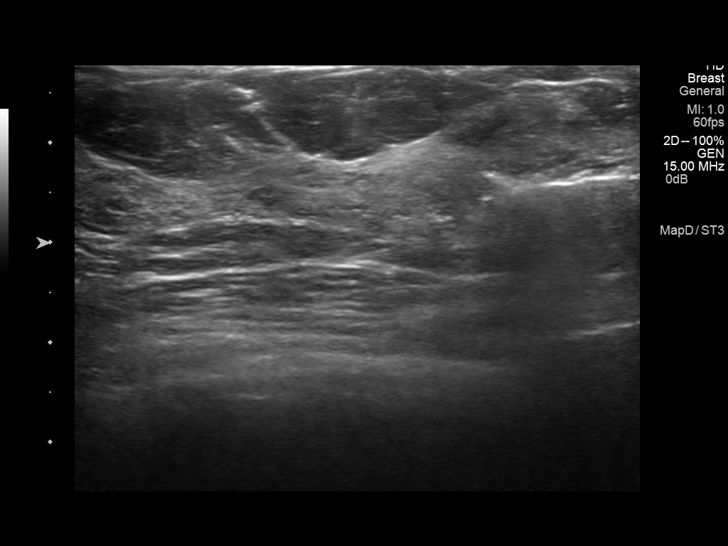
[im 6/13]
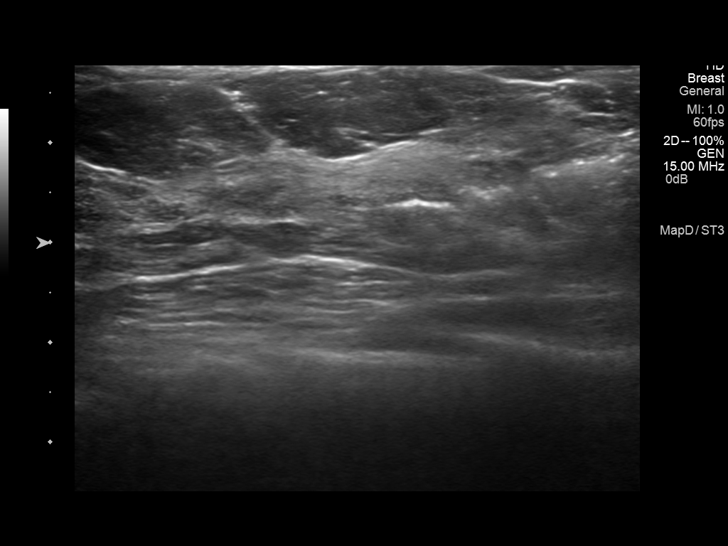
[im 7/13]
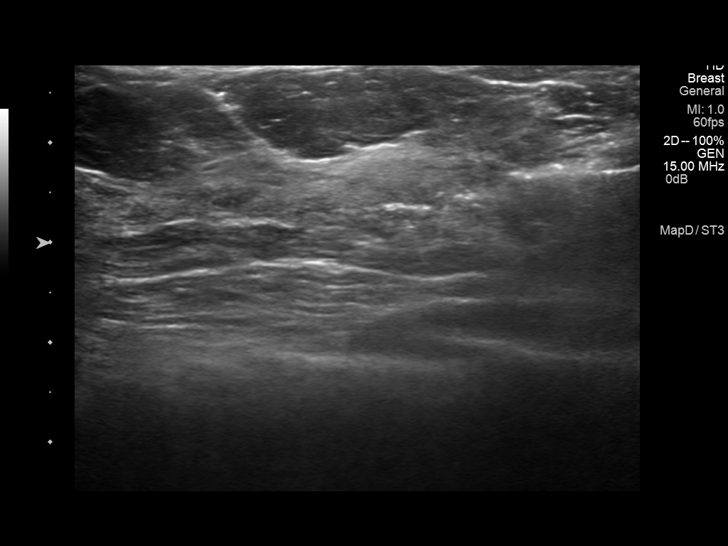
[im 8/13]
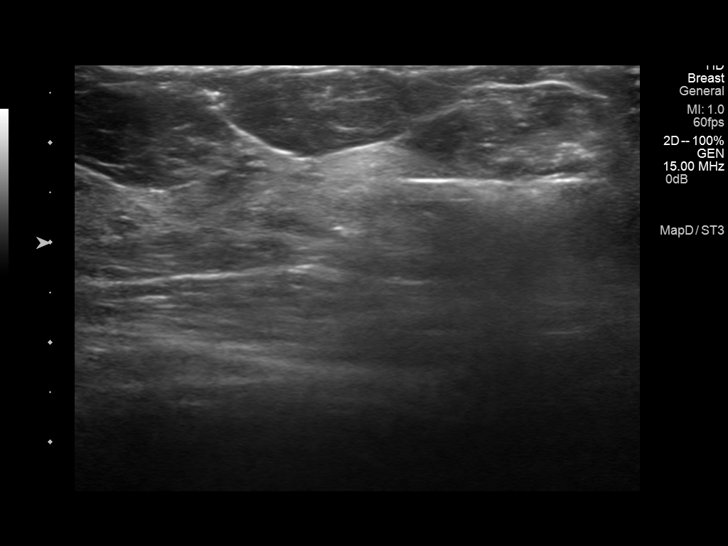
[im 9/13]
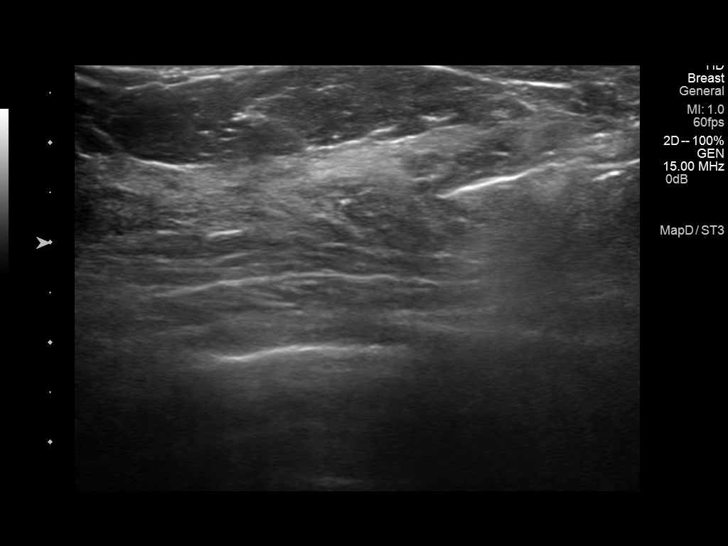
[im 10/13]
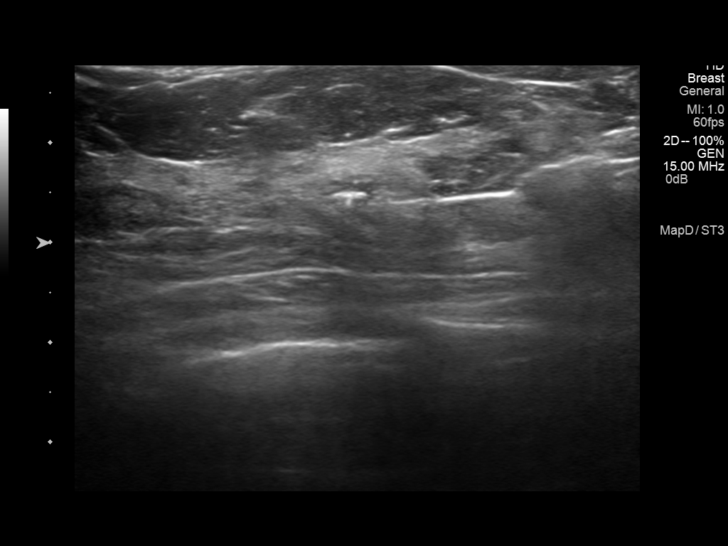
[im 11/13]
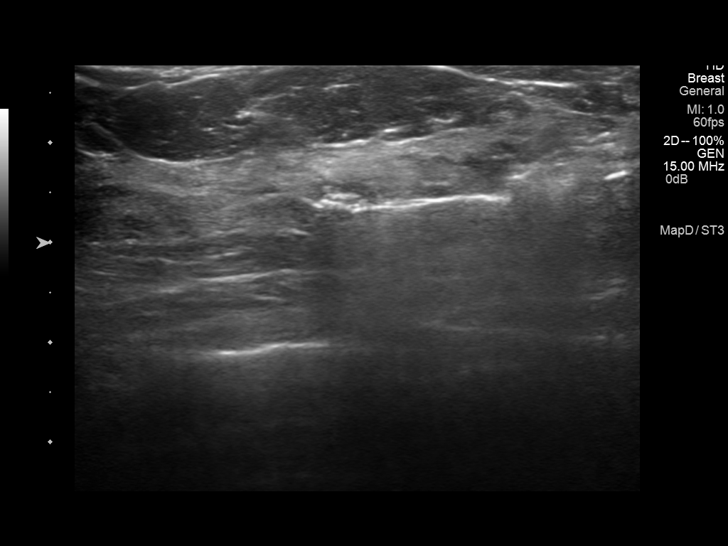
[im 12/13]
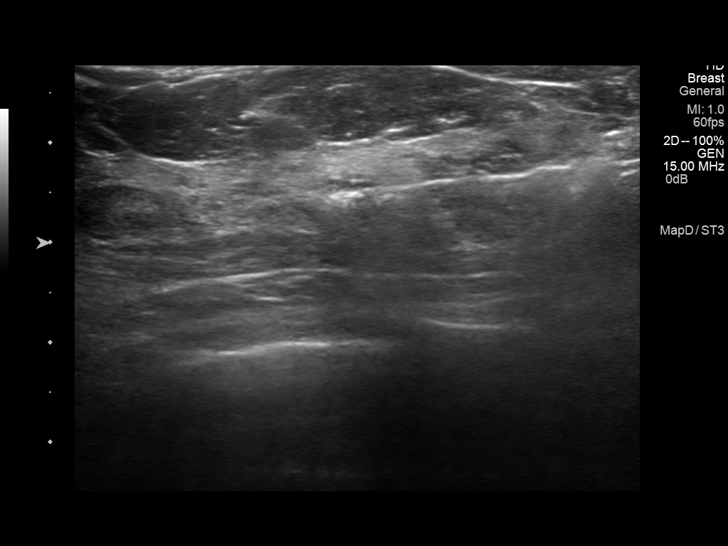
[im 13/13]
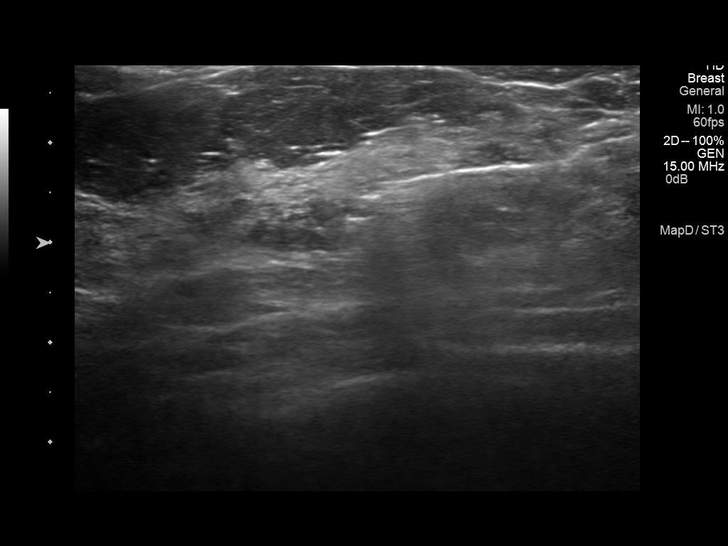

[13 of 13 positions shown; findings below may reference images not displayed]

PROCEDURE:
I met with the patient and we discussed the procedure of
ultrasound-guided biopsy, including benefits and alternatives. We
discussed the high likelihood of a successful procedure. We
discussed the risks of the procedure including infection, bleeding,
tissue injury, clip migration, and inadequate sampling. Informed
written consent was given. The usual time-out protocol was performed
immediately prior to the procedure.

Using sterile technique and 2% Lidocaine as local anesthetic, under
direct ultrasound visualization, a 12 gauge vacuum assisteddevice
was used to perform biopsy of the mass with calcifications located
within the left breast at the 2:30 o'clock position using a lateral
approach. A specimen mammogram was obtained following the biopsy
which demonstrated calcifications to be present within the specimen.
At the conclusion of the procedure, a wing shaped tissue marker clip
was deployed into the biopsy cavity. Follow-up 2-view mammogram was
performed and dictated separately.
IMPRESSION: Ultrasound-guided biopsy of the left breast mass with calcifications
located at the 2:30 o'clock position as discussed above. No apparent
complications.

## 2016-04-04 ENCOUNTER — Ambulatory Visit (HOSPITAL_COMMUNITY): Payer: BLUE CROSS/BLUE SHIELD

## 2016-04-04 ENCOUNTER — Other Ambulatory Visit (HOSPITAL_COMMUNITY): Payer: BLUE CROSS/BLUE SHIELD

## 2016-05-04 ENCOUNTER — Encounter (HOSPITAL_COMMUNITY): Payer: Self-pay | Admitting: Hematology

## 2016-05-04 ENCOUNTER — Encounter (HOSPITAL_COMMUNITY): Payer: 59 | Attending: Oncology

## 2016-05-04 ENCOUNTER — Encounter (HOSPITAL_BASED_OUTPATIENT_CLINIC_OR_DEPARTMENT_OTHER): Payer: 59 | Admitting: Hematology

## 2016-05-04 VITALS — BP 144/62 | HR 54 | Temp 98.0°F | Resp 16 | Wt 161.0 lb

## 2016-05-04 DIAGNOSIS — Z853 Personal history of malignant neoplasm of breast: Secondary | ICD-10-CM | POA: Diagnosis not present

## 2016-05-04 DIAGNOSIS — D509 Iron deficiency anemia, unspecified: Secondary | ICD-10-CM | POA: Insufficient documentation

## 2016-05-04 DIAGNOSIS — D0512 Intraductal carcinoma in situ of left breast: Secondary | ICD-10-CM

## 2016-05-04 LAB — CBC WITH DIFFERENTIAL/PLATELET
Basophils Absolute: 0 10*3/uL (ref 0.0–0.1)
Basophils Relative: 1 %
Eosinophils Absolute: 0.1 10*3/uL (ref 0.0–0.7)
Eosinophils Relative: 2 %
HEMATOCRIT: 36.1 % (ref 36.0–46.0)
HEMOGLOBIN: 11.6 g/dL — AB (ref 12.0–15.0)
Lymphocytes Relative: 31 %
Lymphs Abs: 1.2 10*3/uL (ref 0.7–4.0)
MCH: 23.3 pg — AB (ref 26.0–34.0)
MCHC: 32.1 g/dL (ref 30.0–36.0)
MCV: 72.6 fL — AB (ref 78.0–100.0)
MONO ABS: 0.3 10*3/uL (ref 0.1–1.0)
Monocytes Relative: 9 %
NEUTROS ABS: 2.3 10*3/uL (ref 1.7–7.7)
NEUTROS PCT: 58 %
Platelets: 204 10*3/uL (ref 150–400)
RBC: 4.97 MIL/uL (ref 3.87–5.11)
RDW: 14.9 % (ref 11.5–15.5)
WBC: 3.9 10*3/uL — ABNORMAL LOW (ref 4.0–10.5)

## 2016-05-04 LAB — COMPREHENSIVE METABOLIC PANEL
ALBUMIN: 4.3 g/dL (ref 3.5–5.0)
ALK PHOS: 73 U/L (ref 38–126)
ALT: 20 U/L (ref 14–54)
ANION GAP: 9 (ref 5–15)
AST: 25 U/L (ref 15–41)
BILIRUBIN TOTAL: 0.7 mg/dL (ref 0.3–1.2)
BUN: 19 mg/dL (ref 6–20)
CO2: 29 mmol/L (ref 22–32)
Calcium: 9.7 mg/dL (ref 8.9–10.3)
Chloride: 100 mmol/L — ABNORMAL LOW (ref 101–111)
Creatinine, Ser: 1.14 mg/dL — ABNORMAL HIGH (ref 0.44–1.00)
GFR calc non Af Amer: 50 mL/min — ABNORMAL LOW (ref >60)
GFR, EST AFRICAN AMERICAN: 58 mL/min — AB (ref >60)
GLUCOSE: 100 mg/dL — AB (ref 65–99)
Potassium: 3.8 mmol/L (ref 3.5–5.1)
Sodium: 138 mmol/L (ref 135–145)
TOTAL PROTEIN: 8 g/dL (ref 6.5–8.1)

## 2016-05-04 NOTE — Patient Instructions (Addendum)
[place] at [facility] Discharge Instructions  RECOMMENDATIONS MADE BY THE CONSULTANT AND ANY TEST RESULTS WILL BE SENT TO YOUR REFERRING PHYSICIAN.  You were seen today by Dr. Irene Limbo Follow up in 4 months with lab work See Amy up front for appointments   Thank you for choosing [place] at [facility] to provide your oncology and hematology care.  To afford each patient quality time with our provider, please arrive at least 15 minutes before your scheduled appointment time.    If you have a lab appointment with the [facility] please come in thru the  Main Entrance and check in at the main information desk  You need to re-schedule your appointment should you arrive 10 or more minutes late.  We strive to give you quality time with our providers, and arriving late affects you and other patients whose appointments are after yours.  Also, if you no show three or more times for appointments you may be dismissed from the clinic at the providers discretion.     Again, thank you for choosing [facility].  Our hope is that these requests will decrease the amount of time that you wait before being seen by our physicians.       _____________________________________________________________  Should you have questions after your visit to [facility], please contact our office at [phone] between the hours of 8:30 a.m. and 4:30 p.m.  Voicemails left after 4:30 p.m. will not be returned until the following business day.  For prescription refill requests, have your pharmacy contact our office.       Resources For Cancer Patients and their Caregivers ? American Cancer Society: Can assist with transportation, wigs, general needs, runs Look Good Feel Better.        680-412-2133 ? Cancer Care: Provides financial assistance, online support groups, medication/co-pay assistance.  1-800-813-HOPE 352-303-9276) ? [place] Assists [place] cancer patients and their families through emotional , educational and financial support.  602-443-0643 ? [place] DSS Where to apply for food stamps, Medicaid and utility assistance. 561-399-7278 ? RCATS: Transportation to medical appointments. (925)585-9135 ? Social Security Administration: May apply for disability if have a Stage IV cancer. (802)447-7438 973-621-3710 ? [employer], Disability and Transit Services: Assists with nutrition, care and transit needs. [place] Support Programs: @10RELATIVEDAYS @ > Cancer Support Group  2nd Tuesday of the month 1pm-2pm, Journey Room  > Creative Journey  3rd Tuesday of the month 1130am-1pm, Journey Room  > Look Good Feel Better  1st Wednesday of the month 10am-12 noon, Journey Room (Call [place] to register [phone])

## 2016-05-06 NOTE — Progress Notes (Signed)
Marland Kitchen  HEMATOLOGY ONCOLOGY PROGRESS NOTE  Date of service: .05/04/2016  Patient Care Team: Shade Flood, MD as PCP - General (Family Medicine)  CC: Follow-up of breast DCIS  Diagnosis: DCIS with 2 foci of invasion at 63m and 2761min dimension ER- PR-   Current Treatment: active surveillance  SUMMARY OF ONCOLOGIC HISTORY:   Ductal carcinoma in situ (DCIS) of left breast   12/17/2013 Mammogram    BIRADS category 0, calcifications in the left breast      01/06/2014 Imaging    Ultrasound showing a 1.7 x 0.7 x 1.0 irregular hypoechoic mass at the 3 o-clock position 6 cm from nipple      01/06/2014 Mammogram    Unilateral L diagnostic mammogram with 3.1x1.6 cm group of pleomorphic calcifications within the lateral L breast, posterior depth      01/27/2014 Initial Biopsy    Ultrasoung guided core biopsy with pathology showing high grade DCIS, ER-, PR-      02/04/2014 Imaging    Breast MRI,3.0 x 1.3 x 1.0 cm biopsy proven DCIS in the posterior aspect of the lower outer quadrant of the left breast      02/25/2014 Definitive Surgery    Left partial mastectomy, sentinel lymph node biopsy with Dr. JeArnoldo Morale    02/27/2014 Initial Diagnosis    Neoplasm of left breast, regional lymph node staging category pN0 per American Joint Committee on Cancer Staging Guidelines, 7th edition      03/03/2014 Pathology Results    Invasive adenocarcinoma, 2 foci, 61m261mnd 2mm63megative for LVI, high grade DCIS, ER-, PR-. One lymph node negative for tumor. Grade III/III. mpT1a, pN0,pMX      04/13/2014 - 05/11/2014 Radiation Therapy    42.72 Gy at 2.67 Gy per fraction of left breast x 16 fractions and 10 Gy left breast boost at 2 Gy per fraction x 5 by Dr. WentPablo Ledger   INTERVAL HISTORY:  Patient is here for her 6 month follow-up for high-grade left breast DCIS ER/PR negative with microinvasion. She notes no new concerns on breast self-examination. Had a mammogram on 03/21/2016 that showed no  mammographic evidence of breast cancer.  REVIEW OF SYSTEMS:    10 Point review of systems of done and is negative except as noted above.  . Past Medical History:  Diagnosis Date  . Breast cancer (HCC)[place]/13/2016   Left breast  . Ductal carcinoma in situ (DCIS) of left breast 03/18/2014   DCIS with invasive tumor at 1 mm and 2 mm in size, grade 3 of 3, sentinel node negative for tumor. Invasive tumor 5 mm from nearest margin. ER negative PR negative HER-2/neu reported as pending. Next  mpT1a, pN0, pMX   . Hypercholesteremia   . Hypertension   . Microcytic anemia 03/04/2015  . Personal history of radiation therapy   . PONV (postoperative nausea and vomiting)     . Past Surgical History:  Procedure Laterality Date  . ABDOMINAL HYSTERECTOMY    . PARTIAL MASTECTOMY WITH AXILLARY SENTINEL LYMPH NODE BIOPSY Left 02/25/2014   Procedure: PARTIAL MASTECTOMY WITH AXILLARY SENTINEL LYMPH NODE BIOPSY;  Surgeon: MarkJamesetta So;  Location: [facility];  Service: General;  Laterality: Left;  . TUBAL LIGATION      . Social History  Substance Use Topics  . Smoking status: Never Smoker  . Smokeless tobacco: Never Used  . Alcohol use Yes     Comment: occassional    ALLERGIES:  has No  Known Allergies.  MEDICATIONS:  Current Outpatient Prescriptions  Medication Sig Dispense Refill  . amLODipine (NORVASC) 10 MG tablet Take 10 mg by mouth daily.    Marland Kitchen atorvastatin (LIPITOR) 20 MG tablet Take 20 mg by mouth daily at 6 PM.    . lisinopril (PRINIVIL,ZESTRIL) 40 MG tablet Take 40 mg by mouth daily.    . polyethylene glycol-electrolytes (TRILYTE) 420 g solution Take 4,000 mLs by mouth as directed. 4000 mL 0  . triamterene-hydrochlorothiazide (DYAZIDE) 37.5-25 MG per capsule Take 1 capsule by mouth daily.     No current facility-administered medications for this visit.     PHYSICAL EXAMINATION: ECOG PERFORMANCE STATUS: 1 - Symptomatic but completely ambulatory  . Vitals:   05/04/16 1234  BP:  (!) 144/62  Pulse: (!) 54  Resp: 16  Temp: 98 F (36.7 C)    Filed Weights   05/04/16 1234  Weight: 161 lb (73 kg)   .Body mass index is 25.22 kg/m.  GENERAL:alert, in no acute distress and comfortable SKIN: no acute rashes, no significant lesions EYES: conjunctiva are pink and non-injected, sclera anicteric OROPHARYNX: MMM, no exudates, no oropharyngeal erythema or ulceration NECK: supple, no JVD LYMPH:  no palpable lymphadenopathy in the cervical, axillary or inguinal regions LUNGS: clear to auscultation b/l with normal respiratory effort BREAST: (done with Chaperone) No palpable breast lumps or regional lymphadenopathy noted. HEART: regular rate & rhythm ABDOMEN:  normoactive bowel sounds , non tender, not distended. Extremity: no pedal edema PSYCH: alert & oriented x 3 with fluent speech NEURO: no focal motor/sensory deficits  LABORATORY DATA:   I have reviewed the data as listed  . CBC Latest Ref Rng & Units 05/04/2016 09/30/2015 06/04/2015  WBC 4.0 - 10.5 K/uL 3.9(L) 4.1 4.4  Hemoglobin 12.0 - 15.0 g/dL 11.6(L) 11.6(L) 10.7(L)  Hematocrit 36.0 - 46.0 % 36.1 37.0 33.5(L)  Platelets 150 - 400 K/uL 204 220 173    . CMP Latest Ref Rng & Units 05/04/2016 09/30/2015 06/04/2015  Glucose 65 - 99 mg/dL 100(H) 116(H) 105(H)  BUN 6 - 20 mg/dL 19 24(H) 22(H)  Creatinine 0.44 - 1.00 mg/dL 1.14(H) 1.11(H) 1.12(H)  Sodium 135 - 145 mmol/L 138 138 143  Potassium 3.5 - 5.1 mmol/L 3.8 4.4 4.1  Chloride 101 - 111 mmol/L 100(L) 102 104  CO2 22 - 32 mmol/L '29 29 29  '$ Calcium 8.9 - 10.3 mg/dL 9.7 9.7 9.4  Total Protein 6.5 - 8.1 g/dL 8.0 8.1 7.6  Total Bilirubin 0.3 - 1.2 mg/dL 0.7 0.7 0.4  Alkaline Phos 38 - 126 U/L 73 72 61  AST 15 - 41 U/L '25 25 28  '$ ALT 14 - 54 U/L '20 21 19   '$ MMG 03/21/2016  RECOMMENDATION: Diagnostic mammogram is suggested in 1 year. (Code:[id number]-01Y)  I have discussed the findings and recommendations with the patient. Results were also provided in writing at  the conclusion of the visit. If applicable, a reminder letter will be sent to the patient regarding the next appointment.  BI-RADS CATEGORY  2: Benign.   Electronically Signed   By: Everlean Alstrom M.D.   On: 03/21/2016 08:53  RADIOGRAPHIC STUDIES: I have personally reviewed the radiological images as listed and agreed with the findings in the report. No results found.  ASSESSMENT & PLAN:   66 year old with  #1 left breast high-grade DCIS with microinvasion ER/PR negative. Diagnosed in December 2015. Status post lumpectomy radiation Patient is here for her scheduled follow-up. Plan -Patient has no clinical or mammographic evidence  of breast cancer recurrence at this time. -Not on endocrine therapy due to hormone negative DCIS -She reports that she has been scheduled for a follow-up colonoscopy. -Counseled to maintain an active lifestyle and maintaining close to ideal body weight as possible.  Return to clinic in 4 months with labs.   I spent 15 minutes counseling the patient face to face. The total time spent in the appointment was 15 minutes and more than 50% was on counseling and direct patient cares.    Sullivan Lone MD [place] AAHIVMS [facility] Hematology/Oncology Physician [facility]  (Office):       [phone] (Work cell):  [phone] (Fax):           [phone]

## 2016-08-17 ENCOUNTER — Encounter (INDEPENDENT_AMBULATORY_CARE_PROVIDER_SITE_OTHER): Payer: Self-pay | Admitting: *Deleted

## 2016-09-04 ENCOUNTER — Other Ambulatory Visit (HOSPITAL_COMMUNITY): Payer: BLUE CROSS/BLUE SHIELD

## 2016-09-04 ENCOUNTER — Ambulatory Visit (HOSPITAL_COMMUNITY): Payer: BLUE CROSS/BLUE SHIELD

## 2016-09-26 ENCOUNTER — Other Ambulatory Visit (HOSPITAL_COMMUNITY): Payer: 59

## 2016-09-26 ENCOUNTER — Ambulatory Visit (HOSPITAL_COMMUNITY): Payer: Self-pay

## 2016-10-13 ENCOUNTER — Other Ambulatory Visit (HOSPITAL_COMMUNITY): Payer: 59

## 2016-10-13 ENCOUNTER — Ambulatory Visit (HOSPITAL_COMMUNITY): Payer: Self-pay | Admitting: Adult Health

## 2016-11-02 ENCOUNTER — Ambulatory Visit (HOSPITAL_COMMUNITY): Payer: Self-pay | Admitting: Adult Health

## 2016-11-02 ENCOUNTER — Other Ambulatory Visit (HOSPITAL_COMMUNITY): Payer: 59

## 2017-04-19 ENCOUNTER — Other Ambulatory Visit (HOSPITAL_COMMUNITY): Payer: Self-pay | Admitting: Nephrology

## 2017-04-19 DIAGNOSIS — N183 Chronic kidney disease, stage 3 unspecified: Secondary | ICD-10-CM

## 2017-05-18 ENCOUNTER — Ambulatory Visit (HOSPITAL_COMMUNITY)
Admission: RE | Admit: 2017-05-18 | Discharge: 2017-05-18 | Disposition: A | Discharge status: Home / Self Care | Payer: 59 | Source: Ambulatory Visit | Attending: Nephrology | Admitting: Nephrology

## 2017-05-18 DIAGNOSIS — N183 Chronic kidney disease, stage 3 unspecified: Secondary | ICD-10-CM

## 2017-05-25 ENCOUNTER — Ambulatory Visit (HOSPITAL_COMMUNITY): Payer: 59

## 2018-02-01 ENCOUNTER — Encounter (HOSPITAL_COMMUNITY): Payer: Self-pay

## 2018-02-01 ENCOUNTER — Emergency Department (HOSPITAL_COMMUNITY)
Admission: EM | Admit: 2018-02-01 | Discharge: 2018-02-01 | Disposition: A | Discharge status: Home / Self Care | Payer: 59 | Attending: Emergency Medicine | Admitting: Emergency Medicine

## 2018-02-01 ENCOUNTER — Other Ambulatory Visit: Payer: Self-pay

## 2018-02-01 DIAGNOSIS — R531 Weakness: Secondary | ICD-10-CM | POA: Insufficient documentation

## 2018-02-01 DIAGNOSIS — Z79899 Other long term (current) drug therapy: Secondary | ICD-10-CM | POA: Diagnosis not present

## 2018-02-01 DIAGNOSIS — D649 Anemia, unspecified: Secondary | ICD-10-CM | POA: Insufficient documentation

## 2018-02-01 LAB — BASIC METABOLIC PANEL
Anion gap: 9 (ref 5–15)
BUN: 21 mg/dL (ref 8–23)
CO2: 27 mmol/L (ref 22–32)
CREATININE: 1.27 mg/dL — AB (ref 0.44–1.00)
Calcium: 9.2 mg/dL (ref 8.9–10.3)
Chloride: 102 mmol/L (ref 98–111)
GFR calc Af Amer: 51 mL/min — ABNORMAL LOW (ref >60)
GFR calc non Af Amer: 44 mL/min — ABNORMAL LOW (ref >60)
Glucose, Bld: 160 mg/dL — ABNORMAL HIGH (ref 70–99)
Potassium: 3.4 mmol/L — ABNORMAL LOW (ref 3.5–5.1)
Sodium: 138 mmol/L (ref 135–145)

## 2018-02-01 LAB — URINALYSIS, ROUTINE W REFLEX MICROSCOPIC
Bilirubin Urine: NEGATIVE
Glucose, UA: NEGATIVE mg/dL
Hgb urine dipstick: NEGATIVE
Ketones, ur: NEGATIVE mg/dL
Leukocytes, UA: NEGATIVE
Nitrite: NEGATIVE
PROTEIN: NEGATIVE mg/dL
Specific Gravity, Urine: 1.011 (ref 1.005–1.030)
pH: 6 (ref 5.0–8.0)

## 2018-02-01 LAB — CBC
HCT: 35.1 % — ABNORMAL LOW (ref 36.0–46.0)
Hemoglobin: 10.6 g/dL — ABNORMAL LOW (ref 12.0–15.0)
MCH: 22.6 pg — ABNORMAL LOW (ref 26.0–34.0)
MCHC: 30.2 g/dL (ref 30.0–36.0)
MCV: 75 fL — ABNORMAL LOW (ref 80.0–100.0)
PLATELETS: 200 10*3/uL (ref 150–400)
RBC: 4.68 MIL/uL (ref 3.87–5.11)
RDW: 15.8 % — AB (ref 11.5–15.5)
WBC: 5.7 10*3/uL (ref 4.0–10.5)
nRBC: 0 % (ref 0.0–0.2)

## 2018-02-01 LAB — CBG MONITORING, ED: Glucose-Capillary: 111 mg/dL — ABNORMAL HIGH (ref 70–99)

## 2018-02-01 MED ORDER — SODIUM CHLORIDE 0.9 % IV BOLUS
1000.0000 mL | Freq: Once | INTRAVENOUS | Status: AC
  Administered 2018-02-01: 1000 mL via INTRAVENOUS

## 2018-02-01 MED ORDER — FERROUS SULFATE 325 (65 FE) MG PO TABS: 325.0000 mg | ORAL_TABLET | Freq: Every day | ORAL | 1 refills | Status: AC

## 2018-02-01 NOTE — ED Triage Notes (Signed)
Pt reports had been having lower back pain since yesterday.  Reports she took some tylenol when she got to work and started feeling dizzy and nauseated.  Pt says she sat down and started feeling diaphoretic.  EMS arrived and pt was diaphoretic, c/o weakness, and thready radial pulses.  Frequent pvc's and bradycardia on monitor.  Reports PVC's improved but pt vomited x 1.  EMS administered 22ml bolus and 4mg  zofran.  Pt says feeling better at this time.   Pt says at this time is feeling back to normal.  Reports a "twitching" feeling in her lower back.  EMS says initial bp was 95/38 but increased to 124/58 with fluid.

## 2018-02-01 NOTE — Discharge Instructions (Addendum)
You are slightly anemic.  Recommend iron tablet.  Prescription given.  Also your sugar was slightly elevated today (glucose 160).  Your pulse is between 50 and 60 which is slightly below normal.  Increase fluids.  Eat regular meals.

## 2018-02-01 NOTE — ED Notes (Signed)
Pt states she feels better, ambulates to restroom with no problem.

## 2018-02-02 NOTE — ED Provider Notes (Signed)
[facility] EMERGENCY DEPARTMENT Provider Note   CSN: [id number] Arrival date & time: 02/01/18  1002     History   Chief Complaint Chief Complaint  Patient presents with  . Weakness    HPI Stacy [name] is a 67 y.o. female.  Weakness, nausea, dizziness, lightheadedness at work earlier today.  Felt like she might pass out.  No chest pain, dyspnea, fever, sweats, chills.  Monitor noted frequent PVCs.  She was given IV fluids and IV Zofran via EMS.  This seemed to help.  She did not eat earlier today.  Past history includes hypertension, hypercholesterolemia, left breast cancer, anemia.  In the emergency department, she is feeling much better.  Severity is moderate.  Nothing makes symptoms better or worse.     Past Medical History:  Diagnosis Date  . Breast cancer ([place]) 02/25/2014   Left breast  . Ductal carcinoma in situ (DCIS) of left breast 03/18/2014   DCIS with invasive tumor at 1 mm and 2 mm in size, grade 3 of 3, sentinel node negative for tumor. Invasive tumor 5 mm from nearest margin. ER negative PR negative HER-2/neu reported as pending. Next  mpT1a, pN0, pMX   . Hypercholesteremia   . Hypertension   . Microcytic anemia 03/04/2015  . Personal history of radiation therapy   . PONV (postoperative nausea and vomiting)     Patient Active Problem List   Diagnosis Date Noted  . Microcytic anemia 03/04/2015  . Ductal carcinoma in situ (DCIS) of left breast 03/18/2014    Past Surgical History:  Procedure Laterality Date  . ABDOMINAL HYSTERECTOMY    . PARTIAL MASTECTOMY WITH AXILLARY SENTINEL LYMPH NODE BIOPSY Left 02/25/2014   Procedure: PARTIAL MASTECTOMY WITH AXILLARY SENTINEL LYMPH NODE BIOPSY;  Surgeon: Jamesetta So, MD;  Location: [facility];  Service: General;  Laterality: Left;  . TUBAL LIGATION       OB History   No obstetric history on file.      Home Medications    Prior to Admission medications   Medication Sig Start Date End Date Taking? Authorizing  Provider  atorvastatin (LIPITOR) 20 MG tablet Take 20 mg by mouth daily at 6 PM.   Yes [provider]  lisinopril (PRINIVIL,ZESTRIL) 40 MG tablet Take 40 mg by mouth daily.   Yes [provider]  triamterene-hydrochlorothiazide (DYAZIDE) 37.5-25 MG per capsule Take 1 capsule by mouth daily.   Yes [provider]  ferrous sulfate 325 (65 FE) MG tablet Take 1 tablet (325 mg total) by mouth daily. 02/01/18   Nat Christen, MD  polyethylene glycol-electrolytes (TRILYTE) 420 g solution Take 4,000 mLs by mouth as directed. Patient not taking: Reported on 02/01/2018 09/16/15   Danie Binder, MD    Family History Family History  Problem Relation Age of Onset  . Heart failure Father        died at 3 from CHF  . Heart failure Mother        died at 65 from heart disease  . Hypertension Sister   . Hypertension Sister     Social History Social History   Tobacco Use  . Smoking status: Never Smoker  . Smokeless tobacco: Never Used  Substance Use Topics  . Alcohol use: Yes    Comment: occassional  . Drug use: No     Allergies   Patient has no known allergies.   Review of Systems Review of Systems  All other systems reviewed and are negative.  Physical Exam Updated Vital Signs BP (!) 127/51   Pulse (!) 51   Temp (!) 97.5 F (36.4 C) (Oral)   Resp (!) 21   Ht 5' 7" (1.702 m)   Wt 72.6 kg   SpO2 100%   BMI 25.06 kg/m   Physical Exam Vitals signs and nursing note reviewed.  Constitutional:      Appearance: She is well-developed.     Comments: Nad; no ectopy noted on monitor.  HENT:     Head: Normocephalic and atraumatic.  Eyes:     Conjunctiva/sclera: Conjunctivae normal.  Neck:     Musculoskeletal: Neck supple.  Cardiovascular:     Rate and Rhythm: Normal rate and regular rhythm.  Pulmonary:     Effort: Pulmonary effort is normal.     Breath sounds: Normal breath sounds.  Abdominal:     General: Bowel sounds are normal.      Palpations: Abdomen is soft.  Musculoskeletal: Normal range of motion.  Skin:    General: Skin is warm and dry.  Neurological:     Mental Status: She is alert and oriented to person, place, and time.  Psychiatric:        Behavior: Behavior normal.      ED Treatments / Results  Labs (all labs ordered are listed, but only abnormal results are displayed) Labs Reviewed  BASIC METABOLIC PANEL - Abnormal; Notable for the following components:      Result Value   Potassium 3.4 (*)    Glucose, Bld 160 (*)    Creatinine, Ser 1.27 (*)    GFR calc non Af Amer 44 (*)    GFR calc Af Amer 51 (*)    All other components within normal limits  CBC - Abnormal; Notable for the following components:   Hemoglobin 10.6 (*)    HCT 35.1 (*)    MCV 75.0 (*)    MCH 22.6 (*)    RDW 15.8 (*)    All other components within normal limits  CBG MONITORING, ED - Abnormal; Notable for the following components:   Glucose-Capillary 111 (*)    All other components within normal limits  URINALYSIS, ROUTINE W REFLEX MICROSCOPIC    EKG EKG Interpretation  Date/Time:  Friday February 01 2018 10:13:05 EST Ventricular Rate:  50 PR Interval:    QRS Duration: 91 QT Interval:  473 QTC Calculation: 432 R Axis:   63 Text Interpretation:  Sinus rhythm Baseline wander in lead(s) V2 Confirmed by Nat Christen [phone]) on 02/01/2018 11:13:00 AM   Radiology No results found.  Procedures Procedures (including critical care time)  Medications Ordered in ED Medications  sodium chloride 0.9 % bolus 1,000 mL (0 mLs Intravenous Stopped 02/01/18 1300)     Initial Impression / Assessment and Plan / ED Course  I have reviewed the triage vital signs and the nursing notes.  Pertinent labs & imaging results that were available during my care of the patient were reviewed by me and considered in my medical decision making (see chart for details).    Uncertain etiology of patient's symptom complex.  EKG sinus  bradycardia rate 50.  She is slightly anemic (hemoglobin 10.6).  Glucose stable.  She was observed for 2 to 3 hours in the emergency department.  She felt much better after IV fluids.  Could have been related to dehydration or lack of calories.  Discharge medication ferrous sulfate 325 mg.   Final Clinical Impressions(s) / ED Diagnoses   Final diagnoses:  Weakness  Anemia, unspecified type    ED Discharge Orders         Ordered    ferrous sulfate 325 (65 FE) MG tablet  Daily     02/01/18 1447           Nat Christen, MD 02/02/18 1603

## 2020-04-06 ENCOUNTER — Encounter (INDEPENDENT_AMBULATORY_CARE_PROVIDER_SITE_OTHER): Payer: Self-pay | Admitting: *Deleted

## 2020-11-09 ENCOUNTER — Encounter (INDEPENDENT_AMBULATORY_CARE_PROVIDER_SITE_OTHER): Payer: Self-pay | Admitting: *Deleted

## 2020-11-15 ENCOUNTER — Other Ambulatory Visit (HOSPITAL_COMMUNITY): Payer: Self-pay | Admitting: Internal Medicine

## 2020-11-15 DIAGNOSIS — Z9889 Other specified postprocedural states: Secondary | ICD-10-CM

## 2020-12-08 ENCOUNTER — Ambulatory Visit (HOSPITAL_COMMUNITY)
Admission: RE | Admit: 2020-12-08 | Discharge: 2020-12-08 | Disposition: A | Discharge status: Home / Self Care | Payer: 59 | Source: Ambulatory Visit | Attending: Internal Medicine | Admitting: Internal Medicine

## 2020-12-08 ENCOUNTER — Encounter (HOSPITAL_COMMUNITY): Payer: Self-pay

## 2020-12-08 ENCOUNTER — Other Ambulatory Visit: Payer: Self-pay

## 2020-12-08 DIAGNOSIS — Z9889 Other specified postprocedural states: Secondary | ICD-10-CM | POA: Diagnosis present

## 2021-04-13 ENCOUNTER — Encounter (INDEPENDENT_AMBULATORY_CARE_PROVIDER_SITE_OTHER): Payer: Self-pay | Admitting: *Deleted

## 2021-05-10 ENCOUNTER — Encounter (INDEPENDENT_AMBULATORY_CARE_PROVIDER_SITE_OTHER): Payer: Self-pay | Admitting: *Deleted

## 2021-10-20 ENCOUNTER — Encounter (INDEPENDENT_AMBULATORY_CARE_PROVIDER_SITE_OTHER): Payer: Self-pay | Admitting: *Deleted

## 2023-02-27 ENCOUNTER — Other Ambulatory Visit (HOSPITAL_COMMUNITY): Payer: Self-pay | Admitting: Internal Medicine

## 2023-02-27 DIAGNOSIS — Z1231 Encounter for screening mammogram for malignant neoplasm of breast: Secondary | ICD-10-CM

## 2023-02-28 ENCOUNTER — Encounter (INDEPENDENT_AMBULATORY_CARE_PROVIDER_SITE_OTHER): Payer: Self-pay | Admitting: *Deleted

## 2023-08-29 ENCOUNTER — Encounter (INDEPENDENT_AMBULATORY_CARE_PROVIDER_SITE_OTHER): Payer: Self-pay | Admitting: *Deleted
